# Patient Record
Sex: Female | Born: 1970 | Race: White | Hispanic: No | Marital: Married | Smoking: Never smoker
Health system: Southern US, Community
[De-identification: ages and names within clinical notes are randomized; demographics above are authoritative.]

## PROBLEM LIST (undated history)

## (undated) DIAGNOSIS — K9041 Non-celiac gluten sensitivity: Secondary | ICD-10-CM

## (undated) DIAGNOSIS — D649 Anemia, unspecified: Secondary | ICD-10-CM

## (undated) DIAGNOSIS — E538 Deficiency of other specified B group vitamins: Secondary | ICD-10-CM

## (undated) DIAGNOSIS — K219 Gastro-esophageal reflux disease without esophagitis: Secondary | ICD-10-CM

## (undated) DIAGNOSIS — E041 Nontoxic single thyroid nodule: Secondary | ICD-10-CM

## (undated) HISTORY — DX: Non-celiac gluten sensitivity: K90.41

## (undated) HISTORY — DX: Anemia, unspecified: D64.9

## (undated) HISTORY — PX: TOOTH EXTRACTION: SUR596

---

## 2001-06-12 HISTORY — PX: TUBAL LIGATION: SHX77

## 2008-05-05 ENCOUNTER — Encounter: Payer: Self-pay | Admitting: Family Medicine

## 2008-05-06 LAB — CONVERTED CEMR LAB
HDL: 46 mg/dL
LDL Cholesterol: 70 mg/dL

## 2008-09-06 ENCOUNTER — Encounter: Payer: Self-pay | Admitting: Family Medicine

## 2008-10-13 LAB — CONVERTED CEMR LAB

## 2008-10-31 ENCOUNTER — Encounter: Payer: Self-pay | Admitting: Family Medicine

## 2010-07-27 ENCOUNTER — Ambulatory Visit: Payer: Self-pay | Admitting: Family Medicine

## 2010-07-27 DIAGNOSIS — E0590 Thyrotoxicosis, unspecified without thyrotoxic crisis or storm: Secondary | ICD-10-CM | POA: Insufficient documentation

## 2010-07-27 DIAGNOSIS — D649 Anemia, unspecified: Secondary | ICD-10-CM | POA: Insufficient documentation

## 2010-07-27 DIAGNOSIS — Z862 Personal history of diseases of the blood and blood-forming organs and certain disorders involving the immune mechanism: Secondary | ICD-10-CM

## 2010-07-27 DIAGNOSIS — Z8639 Personal history of other endocrine, nutritional and metabolic disease: Secondary | ICD-10-CM

## 2010-07-27 DIAGNOSIS — E559 Vitamin D deficiency, unspecified: Secondary | ICD-10-CM | POA: Insufficient documentation

## 2010-07-31 LAB — CONVERTED CEMR LAB
Basophils Absolute: 0 10*3/uL (ref 0.0–0.1)
Basophils Relative: 0.7 % (ref 0.0–3.0)
Eosinophils Absolute: 0.1 10*3/uL (ref 0.0–0.7)
Eosinophils Relative: 3.3 % (ref 0.0–5.0)
Free T4: 0.88 ng/dL (ref 0.60–1.60)
HCT: 35.9 % — ABNORMAL LOW (ref 36.0–46.0)
Hemoglobin: 12.4 g/dL (ref 12.0–15.0)
Lymphocytes Relative: 32.9 % (ref 12.0–46.0)
Lymphs Abs: 1.4 10*3/uL (ref 0.7–4.0)
MCHC: 34.7 g/dL (ref 30.0–36.0)
MCV: 92.1 fL (ref 78.0–100.0)
Monocytes Absolute: 0.3 10*3/uL (ref 0.1–1.0)
Monocytes Relative: 8.3 % (ref 3.0–12.0)
Neutro Abs: 2.3 10*3/uL (ref 1.4–7.7)
Neutrophils Relative %: 54.8 % (ref 43.0–77.0)
Platelets: 163 10*3/uL (ref 150.0–400.0)
RBC: 3.9 M/uL (ref 3.87–5.11)
RDW: 13.5 % (ref 11.5–14.6)
T3, Free: 2.9 pg/mL (ref 2.3–4.2)
TSH: 0.78 microintl units/mL (ref 0.35–5.50)
Vit D, 25-Hydroxy: 26 ng/mL — ABNORMAL LOW (ref 30–89)
WBC: 4.2 10*3/uL — ABNORMAL LOW (ref 4.5–10.5)

## 2011-01-01 NOTE — Assessment & Plan Note (Signed)
Summary: NEW PT TO ESTABH/DLO   Vital Signs:  Patient profile:   40 year old female Height:      66 inches Weight:      123.25 pounds BMI:     19.96 Temp:     98.3 degrees F oral Pulse rate:   68 / minute Pulse rhythm:   regular BP sitting:   110 / 62  (right arm) Cuff size:   regular  Vitals Entered By: Linde Gillis CMA Duncan Dull) (July 27, 2010 10:46 AM) CC: new patient, establish care   History of Present Illness: 40 yo here to establish care with no complaints.  Bring her old records with her from [facility].  h/o anemia- most recently checked in 09/2008.  Normal- hbg 12.6, MCV 89.6. Denies any symptoms of SOB or DOE.  Is often fatigued, but she believes that is from her busy lifestyle. Periods are relatively normal, sometimes on the heavy side.  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  When she eats gluten, defintely has abdominal cramping, diarrhea.  Eating a gluten free diet, feels much better.  Unsure if her anemia was related to her gluten insensitivity.  Vitamin D deficiency- Vit D was 26 in 6./2009.  Took a Vitamin D supplement at that time, not taking anything currently.  Is slightly more fatigued than usual.  h/o hyperthyroidism- labs normal in 2009.  Denies any symptoms of hypo or hyperthyroidism.  Well woman- G2P2.  Due for labs.  She refuses all immunizations for religious reasons.  Prefers GYN for Pap smears.  Last pap in 2009, no h/o abnormal pap smears.  s/p BTL in 2002.  Preventive Screening-Counseling & Management  Alcohol-Tobacco     Smoking Status: never  Caffeine-Diet-Exercise     Does Patient Exercise: yes      Drug Use:  no.    Current Medications (verified): 1)  None  Allergies (verified): 1)  ! Ocuflox (Ofloxacin)  Past History:  Family History: Last updated: 07/27/2010 Mom- h/o thyroid cancer.  Social History: Last updated: 07/27/2010 Married 2 children Never Smoked Religion affecting care-  no immunizations Alcohol use-yes Drug use-no Regular exercise-yes Chiropractor  Risk Factors: Exercise: yes (07/27/2010)  Risk Factors: Smoking Status: never (07/27/2010)  Past Medical History: Anemia-NOS Vitamin D deficiency Gluten intolerance  Past Surgical History: Tubal ligation 06/12/2001  Family History: Mom- h/o thyroid cancer.  Social History: Married 2 children Never Smoked Religion affecting care- no immunizations Alcohol use-yes Drug use-no Regular exercise-yes Chiropractor Smoking Status:  never Drug Use:  no Does Patient Exercise:  yes  Review of Systems      See HPI General:  Complains of fatigue; denies weakness and weight loss. Eyes:  Denies blurring. ENT:  Denies difficulty swallowing. CV:  Denies chest pain or discomfort and difficulty breathing at night. Resp:  Denies shortness of breath. GI:  Denies abdominal pain, bloody stools, change in bowel habits, and constipation. GU:  Denies abnormal vaginal bleeding. MS:  Denies joint pain, joint redness, and joint swelling. Derm:  Denies rash. Neuro:  Denies headaches. Psych:  Denies anxiety and depression. Endo:  Denies cold intolerance, excessive thirst, heat intolerance, and polyuria. Heme:  Denies abnormal bruising, bleeding, fevers, pallor, and skin discoloration.  Physical Exam  General:  alert, well-developed, and well-nourished.   Head:  normocephalic.   Eyes:  vision grossly intact and pupils equal.   Ears:  R ear normal and L ear normal.   Nose:  no external deformity.  Mouth:  good dentition.   Neck:  No deformities, masses, or tenderness noted. Lungs:  Normal respiratory effort, chest expands symmetrically. Lungs are clear to auscultation, no crackles or wheezes. Heart:  Normal rate and regular rhythm. S1 and S2 normal without gallop, murmur, click, rub or other extra sounds. Abdomen:  Bowel sounds positive,abdomen soft and non-tender without masses, organomegaly or hernias  noted. Msk:  No deformity or scoliosis noted of thoracic or lumbar spine.   Extremities:  no edema Neurologic:  alert & oriented X3, strength normal in all extremities, and gait normal.   Skin:  Intact without suspicious lesions or rashes Psych:  Cognition and judgment appear intact. Alert and cooperative with normal attention span and concentration. No apparent delusions, illusions, hallucinations   Impression & Recommendations:  Problem # 1:  UNSPECIFIED VITAMIN D DEFICIENCY (ICD-268.9) Assessment Unchanged Recheck Vit D today. Orders: Venipuncture (54098) T-Vitamin D (25-Hydroxy) (11914-78295) Specimen Handling (62130)  Problem # 2:  ANEMIA-NOS (ICD-285.9) Assessment: Unchanged h/o anemia, ? if related to celiac. Recheck CBC today. Orders: Venipuncture (86578) TLB-CBC Platelet - w/Differential (85025-CBCD)  Problem # 3:  HYPERTHYROIDISM, HX OF (ICD-V12.2) Assessment: Unchanged Recheck Thyroid panel. Orders: Venipuncture (46962) TLB-TSH (Thyroid Stimulating Hormone) (84443-TSH) TLB-T4 (Thyrox), Free [phone]) TLB-T3, Free (Triiodothyronine) (84481-T3FREE)  Problem # 4:  Preventive Health Care (ICD-V70.0) Reviewed preventive care protocols, scheduled due services, and updated immunizations Discussed nutrition, exercise, diet, and healthy lifestyle.  Refer to GYN for pap. Refuses all immunizations. FLP today.  Other Orders: Gynecologic Referral (Gyn)  Patient Instructions: 1)  Please stop by to see Shirlee Limerick on your way out. 2)  Have a great weekend.  Prior Medications (reviewed today): None Current Allergies (reviewed today): ! OCUFLOX (OFLOXACIN)  Flu Vaccine Next Due:  Refused TD Next Due:  Refused HDL Result Date:  05/06/2008 HDL Result:  46 LDL Result Date:  05/06/2008 LDL Result:  70 PAP Result Date:  10/13/2008 PAP Result:  historical PAP Next Due:  2 yr

## 2011-05-10 ENCOUNTER — Encounter: Payer: Self-pay | Admitting: Family Medicine

## 2011-05-10 LAB — HM PAP SMEAR

## 2011-05-14 ENCOUNTER — Encounter: Payer: Self-pay | Admitting: Family Medicine

## 2011-05-14 ENCOUNTER — Ambulatory Visit (INDEPENDENT_AMBULATORY_CARE_PROVIDER_SITE_OTHER): Payer: 59 | Admitting: Family Medicine

## 2011-05-14 VITALS — BP 110/70 | HR 85 | Temp 97.9°F | Ht 65.5 in | Wt 129.8 lb

## 2011-05-14 DIAGNOSIS — Z0000 Encounter for general adult medical examination without abnormal findings: Secondary | ICD-10-CM

## 2011-05-14 DIAGNOSIS — Z111 Encounter for screening for respiratory tuberculosis: Secondary | ICD-10-CM

## 2011-05-14 DIAGNOSIS — Z20820 Contact with and (suspected) exposure to varicella: Secondary | ICD-10-CM

## 2011-05-14 DIAGNOSIS — Z23 Encounter for immunization: Secondary | ICD-10-CM

## 2011-05-14 LAB — POCT URINALYSIS DIPSTICK
Bilirubin, UA: NEGATIVE
Blood, UA: NEGATIVE
Glucose, UA: NEGATIVE
Ketones, UA: NEGATIVE
Leukocytes, UA: NEGATIVE
Nitrite, UA: NEGATIVE
Spec Grav, UA: 1.03
Urobilinogen, UA: NEGATIVE
pH, UA: 5

## 2011-05-14 NOTE — Progress Notes (Signed)
History of Present Illness:  40 yo here for physical exam/forms from work.     She is going back to [facility] for radiography, currently a practicing chiropractor. Has no complaints. Typically does get immunizations but she is ok with getting required immunizations today, including Td, ppd and varicella titers.    The PMH, PSH, Social History, Family History, Medications, and allergies have been reviewed in [facility], and have been updated if relevant.   Review of Systems  See HPI  Patient reports no  vision/ hearing changes,anorexia, weight change, fever ,adenopathy, persistant / recurrent hoarseness, swallowing issues, chest pain, edema,persistant / recurrent cough, hemoptysis, dyspnea(rest, exertional, paroxysmal nocturnal), gastrointestinal  bleeding (melena, rectal bleeding), abdominal pain, excessive heart burn, GU symptoms(dysuria, hematuria, pyuria, voiding/incontinence  Issues) syncope, focal weakness, severe memory loss, concerning skin lesions, depression, anxiety, abnormal bruising/bleeding, major joint swelling, breast masses or abnormal vaginal bleeding.     Physical Exam  BP 110/70  Pulse 85  Temp(Src) 97.9 F (36.6 C) (Oral)  Ht 5' 5.5" (1.664 m)  Wt 129 lb 12 oz (58.854 kg)  BMI 21.26 kg/m2  General: alert, well-developed, and well-nourished.  Head: normocephalic.  Eyes: vision grossly intact and pupils equal.  Ears: R ear normal and L ear normal.  Nose: no external deformity.  Mouth: good dentition.  Neck: No deformities, masses, or tenderness noted.  Lungs: Normal respiratory effort, chest expands symmetrically. Lungs are clear to auscultation, no crackles or wheezes.  Heart: Normal rate and regular rhythm. S1 and S2 normal without gallop, murmur, click, rub or other extra sounds.  Abdomen: Bowel sounds positive,abdomen soft and non-tender without masses, organomegaly or hernias noted.  Msk: No deformity or scoliosis noted of thoracic or lumbar spine.  Extremities: no edema   Neurologic: alert & oriented X3, strength normal in all extremities, and gait normal.  Skin: Intact without suspicious lesions or rashes  Psych: Cognition and judgment appear intact. Alert and cooperative with normal attention span and concentration. No apparent delusions, illusions, hallucinations   1. Routine general medical examination at a health care facility     Form filled out and returned to pt. She will return to office to have PPD read. Varicella titers drawn today.

## 2011-05-14 NOTE — Progress Notes (Signed)
Addended by: Gilmer Mor on: 05/14/2011 09:47 AM   Modules accepted: Orders

## 2011-05-15 LAB — VARICELLA ZOSTER ANTIBODY, IGG: Varicella IgG: 3.39 {ISR} — ABNORMAL HIGH

## 2011-05-16 ENCOUNTER — Ambulatory Visit (INDEPENDENT_AMBULATORY_CARE_PROVIDER_SITE_OTHER): Payer: 59 | Admitting: Family Medicine

## 2011-05-16 DIAGNOSIS — IMO0001 Reserved for inherently not codable concepts without codable children: Secondary | ICD-10-CM

## 2011-05-16 DIAGNOSIS — Z111 Encounter for screening for respiratory tuberculosis: Secondary | ICD-10-CM

## 2011-05-16 LAB — READ PPD: TB Skin Test: NEGATIVE mm

## 2011-05-17 NOTE — Progress Notes (Signed)
  Subjective:    Patient ID: Shari Vasquez, female    DOB: [birth year], 40 y.o.   MRN: [id number]  HPI TB test read, neg   Review of Systems     Objective:   Physical Exam        Assessment & Plan:

## 2011-05-18 LAB — VARICELLA ZOSTER ANTIBODY, IGM: Varicella Zoster Ab IgM: 0 (ref <0.91)

## 2011-06-25 ENCOUNTER — Ambulatory Visit (INDEPENDENT_AMBULATORY_CARE_PROVIDER_SITE_OTHER): Payer: 59 | Admitting: Family Medicine

## 2011-06-25 DIAGNOSIS — Z111 Encounter for screening for respiratory tuberculosis: Secondary | ICD-10-CM

## 2011-06-25 NOTE — Progress Notes (Signed)
Second PPD skin test placed today during nurse visit.  Patient needs this for her job.  She will return on Thursday to have it read.

## 2011-06-27 ENCOUNTER — Ambulatory Visit (INDEPENDENT_AMBULATORY_CARE_PROVIDER_SITE_OTHER): Payer: 59 | Admitting: Family Medicine

## 2011-06-27 DIAGNOSIS — IMO0001 Reserved for inherently not codable concepts without codable children: Secondary | ICD-10-CM

## 2011-06-27 DIAGNOSIS — Z111 Encounter for screening for respiratory tuberculosis: Secondary | ICD-10-CM

## 2011-06-27 LAB — TB SKIN TEST
Induration: 0
TB Skin Test: NEGATIVE mm

## 2011-06-27 NOTE — Progress Notes (Signed)
PPD read today during nurse visit.  Negative.

## 2011-11-12 ENCOUNTER — Ambulatory Visit (INDEPENDENT_AMBULATORY_CARE_PROVIDER_SITE_OTHER): Payer: 59 | Admitting: Family Medicine

## 2011-11-12 ENCOUNTER — Encounter: Payer: Self-pay | Admitting: Family Medicine

## 2011-11-12 VITALS — BP 110/78 | HR 72 | Temp 98.1°F | Ht 65.5 in | Wt 127.5 lb

## 2011-11-12 DIAGNOSIS — R300 Dysuria: Secondary | ICD-10-CM

## 2011-11-12 LAB — POCT URINALYSIS DIPSTICK
Bilirubin, UA: NEGATIVE
Blood, UA: NEGATIVE
Glucose, UA: NEGATIVE
Ketones, UA: NEGATIVE
Leukocytes, UA: NEGATIVE
Nitrite, UA: NEGATIVE
Spec Grav, UA: 1.01
Urobilinogen, UA: 2
pH, UA: 8

## 2011-11-12 MED ORDER — CEPHALEXIN 500 MG PO CAPS: 500.0000 mg | ORAL_CAPSULE | Freq: Two times a day (BID) | ORAL | Status: AC

## 2011-11-12 NOTE — Patient Instructions (Signed)
Continue drinking fluids. We will send your urine for culture. Go ahead and start taking Keflex, we will call you in 48 hours with your cx results.

## 2011-11-12 NOTE — Progress Notes (Signed)
SUBJECTIVE: Shari Vasquez is a 40 y.o. female who complains of urinary frequency, urgency and dysuria x 5 days, without flank pain, fever, chills, or abnormal vaginal discharge or bleeding.   Cipro allergy.  Patient Active Problem List  Diagnoses  . UNSPECIFIED VITAMIN D DEFICIENCY  . ANEMIA-NOS  . HYPERTHYROIDISM, HX OF  . Routine general medical examination at a health care facility   Past Medical History  Diagnosis Date  . Anemia   . Vitamin D deficiency   . Gluten intolerance    Past Surgical History  Procedure Date  . Tubal ligation 06/12/2001   History  Substance Use Topics  . Smoking status: Never Smoker   . Smokeless tobacco: Not on file  . Alcohol Use: Yes   Family History  Problem Relation Age of Onset  . Cancer Mother     thyroid   Allergies not on file No current outpatient prescriptions on file prior to visit.   The PMH, PSH, Social History, Family History, Medications, and allergies have been reviewed in [facility], and have been updated if relevant.  OBJECTIVE: Appears well, in no apparent distress.  Vital signs are normal. The abdomen is soft without tenderness, guarding, mass, rebound or organomegaly. No CVA tenderness or inguinal adenopathy noted. Urine dipstick shows negative for all components.  Micro exam: not done.   ASSESSMENT/Plan: UA neg but classic UTI symptoms.  Will treat for UTI uncomplicated without evidence of pyelonephritis and send urine for cx.  Keflex 500 mg twice daily x7 days, also push fluids, may use Pyridium OTC prn. Call or return to clinic prn if these symptoms worsen or fail to improve as anticipated.

## 2011-11-14 LAB — URINE CULTURE
Colony Count: NO GROWTH
Organism ID, Bacteria: NO GROWTH

## 2012-06-16 ENCOUNTER — Ambulatory Visit (INDEPENDENT_AMBULATORY_CARE_PROVIDER_SITE_OTHER): Payer: 59

## 2012-06-16 DIAGNOSIS — Z111 Encounter for screening for respiratory tuberculosis: Secondary | ICD-10-CM

## 2012-06-16 NOTE — Progress Notes (Signed)
Jacki Cones said had spoken with Dr Dayton Martes to order the PPD to be done.

## 2012-06-17 ENCOUNTER — Ambulatory Visit: Payer: 59

## 2012-06-18 LAB — TB SKIN TEST
Induration: 0 mm
TB Skin Test: NEGATIVE

## 2012-07-07 ENCOUNTER — Ambulatory Visit: Payer: 59

## 2012-07-30 ENCOUNTER — Other Ambulatory Visit: Payer: Self-pay | Admitting: Family Medicine

## 2012-07-30 ENCOUNTER — Encounter: Payer: Self-pay | Admitting: Family Medicine

## 2012-07-30 ENCOUNTER — Ambulatory Visit (INDEPENDENT_AMBULATORY_CARE_PROVIDER_SITE_OTHER): Payer: 59 | Admitting: Family Medicine

## 2012-07-30 VITALS — BP 110/72 | HR 60 | Temp 97.9°F | Ht 65.0 in | Wt 129.0 lb

## 2012-07-30 DIAGNOSIS — Z862 Personal history of diseases of the blood and blood-forming organs and certain disorders involving the immune mechanism: Secondary | ICD-10-CM

## 2012-07-30 DIAGNOSIS — R079 Chest pain, unspecified: Secondary | ICD-10-CM

## 2012-07-30 DIAGNOSIS — E559 Vitamin D deficiency, unspecified: Secondary | ICD-10-CM

## 2012-07-30 DIAGNOSIS — D649 Anemia, unspecified: Secondary | ICD-10-CM

## 2012-07-30 DIAGNOSIS — Z0000 Encounter for general adult medical examination without abnormal findings: Secondary | ICD-10-CM

## 2012-07-30 DIAGNOSIS — D72819 Decreased white blood cell count, unspecified: Secondary | ICD-10-CM

## 2012-07-30 DIAGNOSIS — Z136 Encounter for screening for cardiovascular disorders: Secondary | ICD-10-CM

## 2012-07-30 DIAGNOSIS — Z8639 Personal history of other endocrine, nutritional and metabolic disease: Secondary | ICD-10-CM

## 2012-07-30 LAB — COMPREHENSIVE METABOLIC PANEL
ALT: 11 U/L (ref 0–35)
AST: 18 U/L (ref 0–37)
Albumin: 4.2 g/dL (ref 3.5–5.2)
Alkaline Phosphatase: 40 U/L (ref 39–117)
BUN: 10 mg/dL (ref 6–23)
CO2: 25 mEq/L (ref 19–32)
Calcium: 9.2 mg/dL (ref 8.4–10.5)
Chloride: 104 mEq/L (ref 96–112)
Creatinine, Ser: 0.9 mg/dL (ref 0.4–1.2)
GFR: 72.57 mL/min (ref >60.00)
Glucose, Bld: 85 mg/dL (ref 70–99)
Potassium: 4.6 mEq/L (ref 3.5–5.1)
Sodium: 136 mEq/L (ref 135–145)
Total Bilirubin: 0.9 mg/dL (ref 0.3–1.2)
Total Protein: 7.5 g/dL (ref 6.0–8.3)

## 2012-07-30 LAB — CBC WITH DIFFERENTIAL/PLATELET
Basophils Absolute: 0 10*3/uL (ref 0.0–0.1)
Basophils Relative: 0.5 % (ref 0.0–3.0)
Eosinophils Absolute: 0.1 10*3/uL (ref 0.0–0.7)
Eosinophils Relative: 2.6 % (ref 0.0–5.0)
HCT: 37.5 % (ref 36.0–46.0)
Hemoglobin: 12.6 g/dL (ref 12.0–15.0)
Lymphocytes Relative: 40.3 % (ref 12.0–46.0)
Lymphs Abs: 1.2 10*3/uL (ref 0.7–4.0)
MCHC: 33.6 g/dL (ref 30.0–36.0)
MCV: 90.4 fl (ref 78.0–100.0)
Monocytes Absolute: 0.3 10*3/uL (ref 0.1–1.0)
Monocytes Relative: 9 % (ref 3.0–12.0)
Neutro Abs: 1.4 10*3/uL (ref 1.4–7.7)
Neutrophils Relative %: 47.6 % (ref 43.0–77.0)
Platelets: 155 10*3/uL (ref 150.0–400.0)
RBC: 4.15 Mil/uL (ref 3.87–5.11)
RDW: 13 % (ref 11.5–14.6)
WBC: 3 10*3/uL — ABNORMAL LOW (ref 4.5–10.5)

## 2012-07-30 LAB — LIPID PANEL
Cholesterol: 121 mg/dL (ref 0–200)
HDL: 46.7 mg/dL (ref >39.00)
LDL Cholesterol: 67 mg/dL (ref 0–99)
Total CHOL/HDL Ratio: 3
Triglycerides: 38 mg/dL (ref 0.0–149.0)
VLDL: 7.6 mg/dL (ref 0.0–40.0)

## 2012-07-30 NOTE — Progress Notes (Signed)
History of Present Illness:  41 yo here for CPX.  Had pap smear with GYN yesterday. Mammogram scheduled.  Overall, doing well.  She has had intermittent episodes of feeling "bubbles in her epigastric area/chest pain." Never exertional.  Not associated with certain foods. Exercises regularly and has not had any CP or DOE. She does drink 5 cans of soda per day but she does not feel that she drinks more than that when she has these episodes. They are fleeting, tend to occur in multiple episodes.  Has not had an episode since June.    Patient Active Problem List  Diagnosis  . UNSPECIFIED VITAMIN D DEFICIENCY  . ANEMIA-NOS  . HYPERTHYROIDISM, HX OF  . Routine general medical examination at a health care facility   Past Medical History  Diagnosis Date  . Anemia   . Vitamin d deficiency   . Gluten intolerance    Past Surgical History  Procedure Date  . Tubal ligation 06/12/2001   History  Substance Use Topics  . Smoking status: Never Smoker   . Smokeless tobacco: Not on file  . Alcohol Use: Yes   Family History  Problem Relation Age of Onset  . Cancer Mother     thyroid   Allergies  Allergen Reactions  . Ciprofloxacin    No current outpatient prescriptions on file prior to visit.     The PMH, PSH, Social History, Family History, Medications, and allergies have been reviewed in [facility], and have been updated if relevant.   Review of Systems  See HPI  Patient reports no  vision/ hearing changes,anorexia, weight change, fever ,adenopathy, persistant / recurrent hoarseness, swallowing issues, chest pain, edema,persistant / recurrent cough, hemoptysis, dyspnea(rest, exertional, paroxysmal nocturnal), gastrointestinal  bleeding (melena, rectal bleeding), abdominal pain, excessive heart burn, GU symptoms(dysuria, hematuria, pyuria, voiding/incontinence  Issues) syncope, focal weakness, severe memory loss, concerning skin lesions, depression, anxiety, abnormal bruising/bleeding,  major joint swelling, breast masses or abnormal vaginal bleeding.     Physical Exam  BP 110/72  Pulse 60  Temp 97.9 F (36.6 C)  Ht 5\' 5"  (1.651 m)  Wt 129 lb (58.514 kg)  BMI 21.47 kg/m2  General: alert, well-developed, and well-nourished.  Head: normocephalic.  Eyes: vision grossly intact and pupils equal.  Ears: R ear normal and L ear normal.  Nose: no external deformity.  Mouth: good dentition.  Neck: No deformities, masses, or tenderness noted.  Lungs: Normal respiratory effort, chest expands symmetrically. Lungs are clear to auscultation, no crackles or wheezes.  Heart: Normal rate and regular rhythm. S1 and S2 normal without gallop, murmur, click, rub or other extra sounds.  Abdomen: Bowel sounds positive,abdomen soft and non-tender without masses, organomegaly or hernias noted.  Msk: No deformity or scoliosis noted of thoracic or lumbar spine.  Extremities: no edema  Neurologic: alert & oriented X3, strength normal in all extremities, and gait normal.  Skin: Intact without suspicious lesions or rashes  Psych: Cognition and judgment appear intact. Alert and cooperative with normal attention span and concentration. No apparent delusions, illusions, hallucinations    Assessment and Plan: 1. Routine general medical examination at a health care facility  Reviewed preventive care protocols, scheduled due services, and updated immunizations Discussed nutrition, exercise, diet, and healthy lifestyle.  Comprehensive metabolic panel  2. HYPERTHYROIDISM, HX OF    3. Unspecified vitamin D deficiency  Vitamin D, 25-hydroxy  4. ANEMIA-NOS  CBC with Differential  5. Screening for ischemic heart disease  Lipid Panel  6. Chest pain  Atypical- EKG reassuring- NSR. ?palpitations/PVCs vs gerd or esophageal spasm. Has not had episode since June. If symptoms return, she will keep symptom journal call me immediately. Will proceed with EGD and/or Holter monitor. EKG 12-Lead

## 2012-07-30 NOTE — Patient Instructions (Addendum)
Great to see you. We will call you with your lab results.  Try to keep a symptoms journal if your symptoms return- did you drink caffeine that day, is it difficult to swallow, etc. Call me if your symptoms do return- we can consider a Holter Monitor (monitor for irregular heart rhythms).

## 2012-07-31 ENCOUNTER — Telehealth: Payer: Self-pay | Admitting: Family Medicine

## 2012-07-31 LAB — VITAMIN D 25 HYDROXY (VIT D DEFICIENCY, FRACTURES): Vit D, 25-Hydroxy: 28 ng/mL — ABNORMAL LOW (ref 30–89)

## 2012-07-31 NOTE — Telephone Encounter (Signed)
Pt called informed her of her lab results, would like them faxed if normal [phone]

## 2012-07-31 NOTE — Telephone Encounter (Signed)
Left v/m for pt to call back on result note.

## 2012-07-31 NOTE — Telephone Encounter (Signed)
Pt was returning call about lab results. If results are normal she would like them faxed to [phone]. If not please call her back.

## 2012-09-14 ENCOUNTER — Other Ambulatory Visit: Payer: Self-pay | Admitting: Obstetrics

## 2012-09-14 DIAGNOSIS — Z1231 Encounter for screening mammogram for malignant neoplasm of breast: Secondary | ICD-10-CM

## 2012-09-15 ENCOUNTER — Other Ambulatory Visit: Payer: Self-pay | Admitting: Family Medicine

## 2012-09-15 ENCOUNTER — Ambulatory Visit: Payer: Self-pay | Admitting: Hematology and Oncology

## 2012-09-15 ENCOUNTER — Other Ambulatory Visit (INDEPENDENT_AMBULATORY_CARE_PROVIDER_SITE_OTHER): Payer: 59

## 2012-09-15 DIAGNOSIS — E559 Vitamin D deficiency, unspecified: Secondary | ICD-10-CM

## 2012-09-15 DIAGNOSIS — D72819 Decreased white blood cell count, unspecified: Secondary | ICD-10-CM

## 2012-09-15 DIAGNOSIS — D729 Disorder of white blood cells, unspecified: Secondary | ICD-10-CM

## 2012-09-15 LAB — CBC WITH DIFFERENTIAL/PLATELET
Basophils Absolute: 0 10*3/uL (ref 0.0–0.1)
Basophils Relative: 0.8 % (ref 0.0–3.0)
Eosinophils Absolute: 0.1 10*3/uL (ref 0.0–0.7)
Eosinophils Relative: 2.8 % (ref 0.0–5.0)
HCT: 37.9 % (ref 36.0–46.0)
Hemoglobin: 12.6 g/dL (ref 12.0–15.0)
Lymphocytes Relative: 39.1 % (ref 12.0–46.0)
Lymphs Abs: 1.1 10*3/uL (ref 0.7–4.0)
MCHC: 33.1 g/dL (ref 30.0–36.0)
MCV: 91.7 fl (ref 78.0–100.0)
Monocytes Absolute: 0.3 10*3/uL (ref 0.1–1.0)
Monocytes Relative: 9.3 % (ref 3.0–12.0)
Neutro Abs: 1.4 10*3/uL (ref 1.4–7.7)
Neutrophils Relative %: 48 % (ref 43.0–77.0)
Platelets: 175 10*3/uL (ref 150.0–400.0)
RBC: 4.14 Mil/uL (ref 3.87–5.11)
RDW: 13.1 % (ref 11.5–14.6)
WBC: 2.9 10*3/uL — ABNORMAL LOW (ref 4.5–10.5)

## 2012-09-16 LAB — VITAMIN D 25 HYDROXY (VIT D DEFICIENCY, FRACTURES): Vit D, 25-Hydroxy: 25 ng/mL — ABNORMAL LOW (ref 30–89)

## 2012-09-17 ENCOUNTER — Telehealth: Payer: Self-pay

## 2012-09-17 MED ORDER — VITAMIN D3 1.25 MG (50000 UT) PO CAPS: 1.0000 | ORAL_CAPSULE | ORAL | Status: DC

## 2012-09-17 NOTE — Telephone Encounter (Signed)
Pt called to get copy of recent labs mailed to her home. Pt notified done while on phone. Also advised pt of result note re: Vit d3.

## 2012-09-25 ENCOUNTER — Ambulatory Visit
Admission: RE | Admit: 2012-09-25 | Discharge: 2012-09-25 | Disposition: A | Discharge status: Home / Self Care | Payer: 59 | Source: Ambulatory Visit | Attending: Obstetrics | Admitting: Obstetrics

## 2012-09-25 DIAGNOSIS — Z1231 Encounter for screening mammogram for malignant neoplasm of breast: Secondary | ICD-10-CM

## 2012-09-30 ENCOUNTER — Ambulatory Visit: Payer: Self-pay | Admitting: Hematology and Oncology

## 2012-09-30 LAB — CBC CANCER CENTER
Basophil #: 0 x10 3/mm (ref 0.0–0.1)
Basophil %: 1 %
Eosinophil #: 0.1 x10 3/mm (ref 0.0–0.7)
Eosinophil %: 2.7 %
HCT: 39.7 % (ref 35.0–47.0)
HGB: 13.2 g/dL (ref 12.0–16.0)
Lymphocyte #: 1 x10 3/mm (ref 1.0–3.6)
Lymphocyte %: 32.7 %
MCH: 30.3 pg (ref 26.0–34.0)
MCHC: 33.2 g/dL (ref 32.0–36.0)
MCV: 91 fL (ref 80–100)
Monocyte #: 0.2 x10 3/mm (ref 0.2–0.9)
Monocyte %: 8.1 %
Neutrophil #: 1.6 x10 3/mm (ref 1.4–6.5)
Neutrophil %: 55.5 %
Platelet: 180 x10 3/mm (ref 150–440)
RBC: 4.36 10*6/uL (ref 3.80–5.20)
RDW: 12.8 % (ref 11.5–14.5)
WBC: 2.9 x10 3/mm — ABNORMAL LOW (ref 3.6–11.0)

## 2012-10-01 ENCOUNTER — Ambulatory Visit (INDEPENDENT_AMBULATORY_CARE_PROVIDER_SITE_OTHER): Payer: 59

## 2012-10-01 DIAGNOSIS — Z23 Encounter for immunization: Secondary | ICD-10-CM

## 2012-10-02 ENCOUNTER — Ambulatory Visit: Payer: Self-pay | Admitting: Hematology and Oncology

## 2012-10-12 ENCOUNTER — Other Ambulatory Visit: Payer: Self-pay | Admitting: Family Medicine

## 2012-10-13 ENCOUNTER — Other Ambulatory Visit: Payer: Self-pay | Admitting: Family Medicine

## 2012-10-13 NOTE — Telephone Encounter (Signed)
Refill request for vitamin d denied.  Pt was to only have this for 6 weeks, still has some left from previous fill on 09/17/12.

## 2012-10-13 NOTE — Telephone Encounter (Signed)
Is the patient to continue this med?

## 2012-11-04 ENCOUNTER — Ambulatory Visit: Payer: Self-pay | Admitting: Hematology and Oncology

## 2012-11-04 LAB — CBC CANCER CENTER
Basophil #: 0 x10 3/mm (ref 0.0–0.1)
Basophil %: 1.2 %
Eosinophil #: 0.1 x10 3/mm (ref 0.0–0.7)
Eosinophil %: 2.8 %
HCT: 37.8 % (ref 35.0–47.0)
HGB: 12.9 g/dL (ref 12.0–16.0)
Lymphocyte #: 1.1 x10 3/mm (ref 1.0–3.6)
Lymphocyte %: 40.7 %
MCH: 31 pg (ref 26.0–34.0)
MCHC: 34.3 g/dL (ref 32.0–36.0)
MCV: 91 fL (ref 80–100)
Monocyte #: 0.2 x10 3/mm (ref 0.2–0.9)
Monocyte %: 9.1 %
Neutrophil #: 1.3 x10 3/mm — ABNORMAL LOW (ref 1.4–6.5)
Neutrophil %: 46.2 %
Platelet: 188 x10 3/mm (ref 150–440)
RBC: 4.17 10*6/uL (ref 3.80–5.20)
RDW: 13 % (ref 11.5–14.5)
WBC: 2.7 x10 3/mm — ABNORMAL LOW (ref 3.6–11.0)

## 2012-11-04 LAB — MONONUCLEOSIS SCREEN: Mono Test: NEGATIVE

## 2012-11-10 ENCOUNTER — Telehealth: Payer: Self-pay

## 2012-11-10 NOTE — Telephone Encounter (Signed)
Pt left v/m; already scheduled for B 12 level on 11/16/12 at 10:45 am; pt request thyroid panel to be done at that time also. Pt said does not need call back; she will find out on Mon if Dr Dayton Martes agreed to add on.

## 2012-11-11 ENCOUNTER — Other Ambulatory Visit: Payer: Self-pay | Admitting: Family Medicine

## 2012-11-11 DIAGNOSIS — R5381 Other malaise: Secondary | ICD-10-CM

## 2012-11-11 NOTE — Telephone Encounter (Signed)
Yes ok to add on

## 2012-11-11 NOTE — Telephone Encounter (Signed)
Dr Dayton Martes, please specify what test you want. Thank You

## 2012-11-11 NOTE — Telephone Encounter (Signed)
TSH (Dx  780.7)

## 2012-11-16 ENCOUNTER — Other Ambulatory Visit (INDEPENDENT_AMBULATORY_CARE_PROVIDER_SITE_OTHER): Payer: 59

## 2012-11-16 DIAGNOSIS — R5381 Other malaise: Secondary | ICD-10-CM

## 2012-11-16 DIAGNOSIS — R5383 Other fatigue: Secondary | ICD-10-CM

## 2012-11-16 DIAGNOSIS — E559 Vitamin D deficiency, unspecified: Secondary | ICD-10-CM

## 2012-11-16 LAB — TSH: TSH: 1.02 u[IU]/mL (ref 0.35–5.50)

## 2012-11-17 LAB — VITAMIN D 25 HYDROXY (VIT D DEFICIENCY, FRACTURES): Vit D, 25-Hydroxy: 26 ng/mL — ABNORMAL LOW (ref 30–89)

## 2012-11-19 ENCOUNTER — Other Ambulatory Visit: Payer: Self-pay | Admitting: *Deleted

## 2012-11-19 MED ORDER — VITAMIN D (ERGOCALCIFEROL) 1.25 MG (50000 UNIT) PO CAPS: ORAL_CAPSULE | ORAL | Status: DC

## 2012-12-02 ENCOUNTER — Ambulatory Visit: Payer: Self-pay | Admitting: Hematology and Oncology

## 2012-12-16 ENCOUNTER — Other Ambulatory Visit: Payer: Self-pay | Admitting: Family Medicine

## 2012-12-16 NOTE — Telephone Encounter (Signed)
Vitamin D prescribed on 12/19.  She has lab appt to recheck level on 3/5.  Ok to refill?

## 2013-01-02 ENCOUNTER — Ambulatory Visit: Payer: Self-pay | Admitting: Hematology and Oncology

## 2013-01-30 ENCOUNTER — Ambulatory Visit: Payer: Self-pay | Admitting: Hematology and Oncology

## 2013-02-03 ENCOUNTER — Other Ambulatory Visit (INDEPENDENT_AMBULATORY_CARE_PROVIDER_SITE_OTHER): Payer: 59

## 2013-02-03 DIAGNOSIS — E559 Vitamin D deficiency, unspecified: Secondary | ICD-10-CM

## 2013-02-03 DIAGNOSIS — R5383 Other fatigue: Secondary | ICD-10-CM

## 2013-02-03 DIAGNOSIS — R5381 Other malaise: Secondary | ICD-10-CM

## 2013-02-03 LAB — VITAMIN B12: Vitamin B-12: 352 pg/mL (ref 211–911)

## 2013-02-04 LAB — VITAMIN D 25 HYDROXY (VIT D DEFICIENCY, FRACTURES): Vit D, 25-Hydroxy: 30 ng/mL (ref 30–89)

## 2013-03-02 ENCOUNTER — Ambulatory Visit: Payer: Self-pay | Admitting: Hematology and Oncology

## 2013-04-01 ENCOUNTER — Ambulatory Visit: Payer: Self-pay | Admitting: Hematology and Oncology

## 2013-05-02 ENCOUNTER — Ambulatory Visit: Payer: Self-pay | Admitting: Hematology and Oncology

## 2013-06-01 ENCOUNTER — Ambulatory Visit: Payer: Self-pay | Admitting: Hematology and Oncology

## 2013-07-02 ENCOUNTER — Ambulatory Visit: Payer: Self-pay | Admitting: Hematology and Oncology

## 2013-07-08 LAB — COMPREHENSIVE METABOLIC PANEL
Albumin: 4 g/dL (ref 3.4–5.0)
Alkaline Phosphatase: 58 U/L (ref 50–136)
Anion Gap: 8 (ref 7–16)
BUN: 11 mg/dL (ref 7–18)
Bilirubin,Total: 0.5 mg/dL (ref 0.2–1.0)
Calcium, Total: 8.8 mg/dL (ref 8.5–10.1)
Chloride: 104 mmol/L (ref 98–107)
Co2: 28 mmol/L (ref 21–32)
Creatinine: 0.85 mg/dL (ref 0.60–1.30)
EGFR (African American): >60
EGFR (Non-African Amer.): >60
Glucose: 90 mg/dL (ref 65–99)
Osmolality: 278 (ref 275–301)
Potassium: 4.3 mmol/L (ref 3.5–5.1)
SGOT(AST): 15 U/L (ref 15–37)
SGPT (ALT): 14 U/L (ref 12–78)
Sodium: 140 mmol/L (ref 136–145)
Total Protein: 7.5 g/dL (ref 6.4–8.2)

## 2013-07-08 LAB — CBC CANCER CENTER
Basophil #: 0 x10 3/mm (ref 0.0–0.1)
Basophil %: 1 %
Eosinophil #: 0.1 x10 3/mm (ref 0.0–0.7)
Eosinophil %: 3.7 %
HCT: 38 % (ref 35.0–47.0)
HGB: 13.4 g/dL (ref 12.0–16.0)
Lymphocyte #: 1.1 x10 3/mm (ref 1.0–3.6)
Lymphocyte %: 35.5 %
MCH: 31.6 pg (ref 26.0–34.0)
MCHC: 35.3 g/dL (ref 32.0–36.0)
MCV: 90 fL (ref 80–100)
Monocyte #: 0.2 x10 3/mm (ref 0.2–0.9)
Monocyte %: 8 %
Neutrophil #: 1.6 x10 3/mm (ref 1.4–6.5)
Neutrophil %: 51.8 %
Platelet: 167 x10 3/mm (ref 150–440)
RBC: 4.25 10*6/uL (ref 3.80–5.20)
RDW: 12.9 % (ref 11.5–14.5)
WBC: 3.1 x10 3/mm — ABNORMAL LOW (ref 3.6–11.0)

## 2013-07-08 LAB — TSH: Thyroid Stimulating Horm: 0.99 u[IU]/mL

## 2013-07-08 LAB — SEDIMENTATION RATE: Erythrocyte Sed Rate: 6 mm/hr (ref 0–20)

## 2013-08-02 ENCOUNTER — Ambulatory Visit: Payer: Self-pay | Admitting: Hematology and Oncology

## 2013-08-14 IMAGING — MG MM SCREEN MAMMOGRAM BILATERAL
5 series · 5 of 5 positions shown · non-contrast
Comparison: Previous exams.

CLINICAL DATA: Screening.

DIGITAL BILATERAL SCREENING MAMMOGRAM WITH CAD

[R CC]
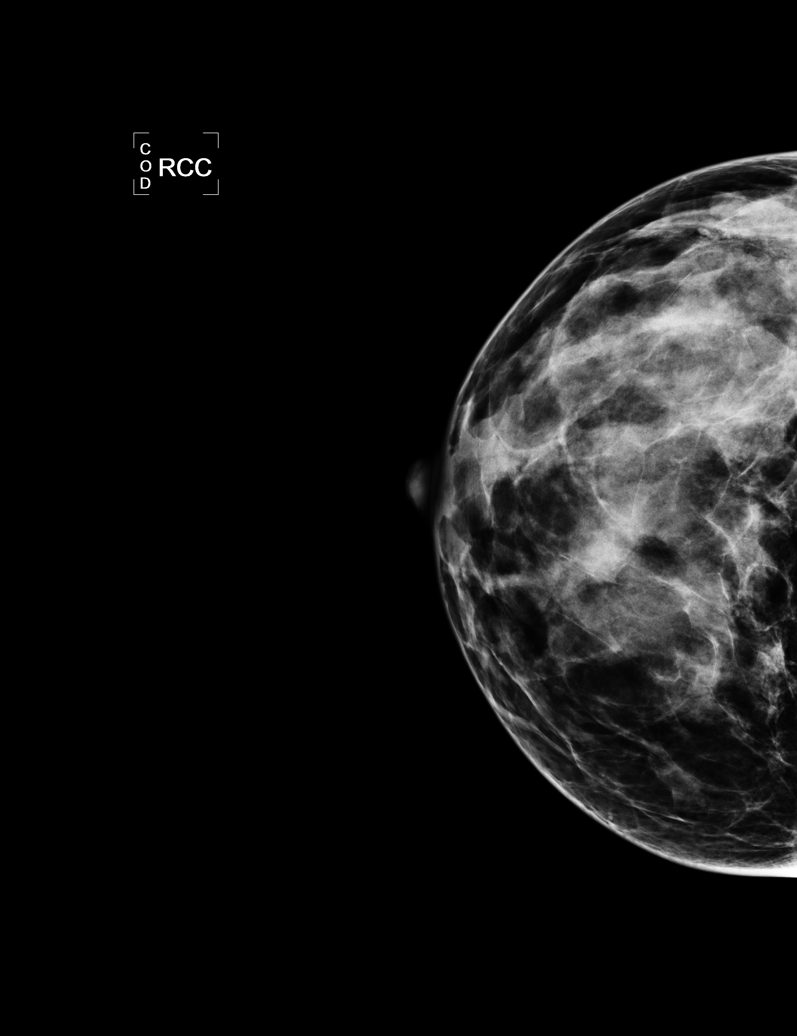

[L CC]
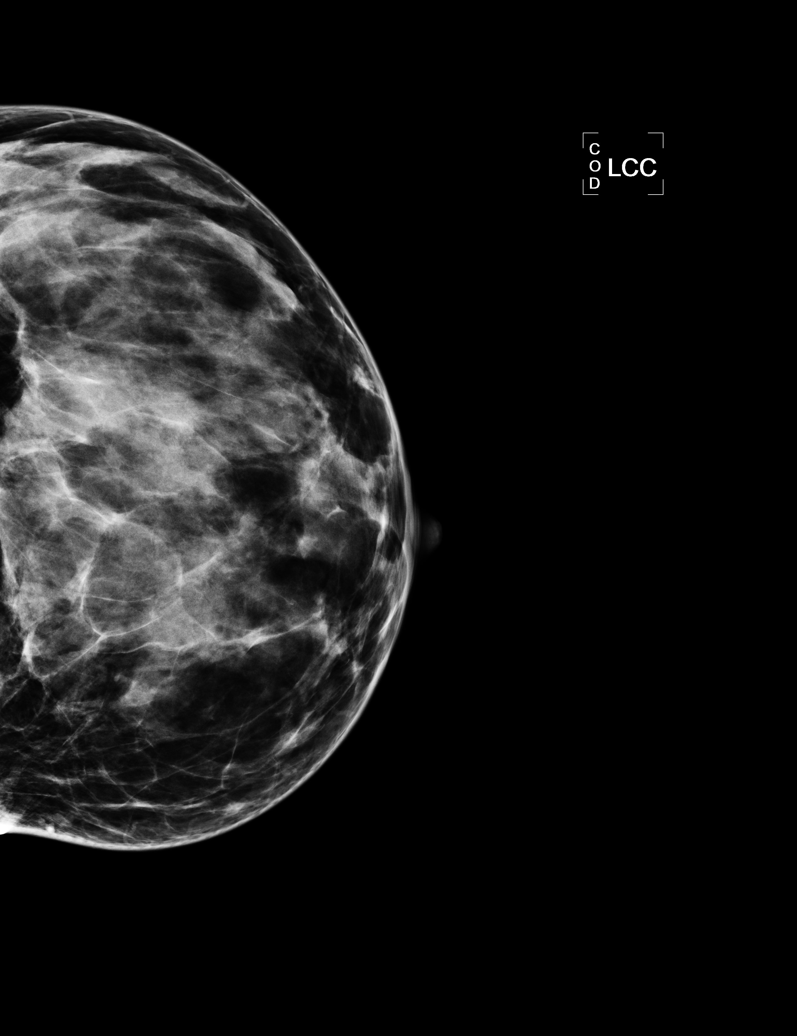

[L MLO]
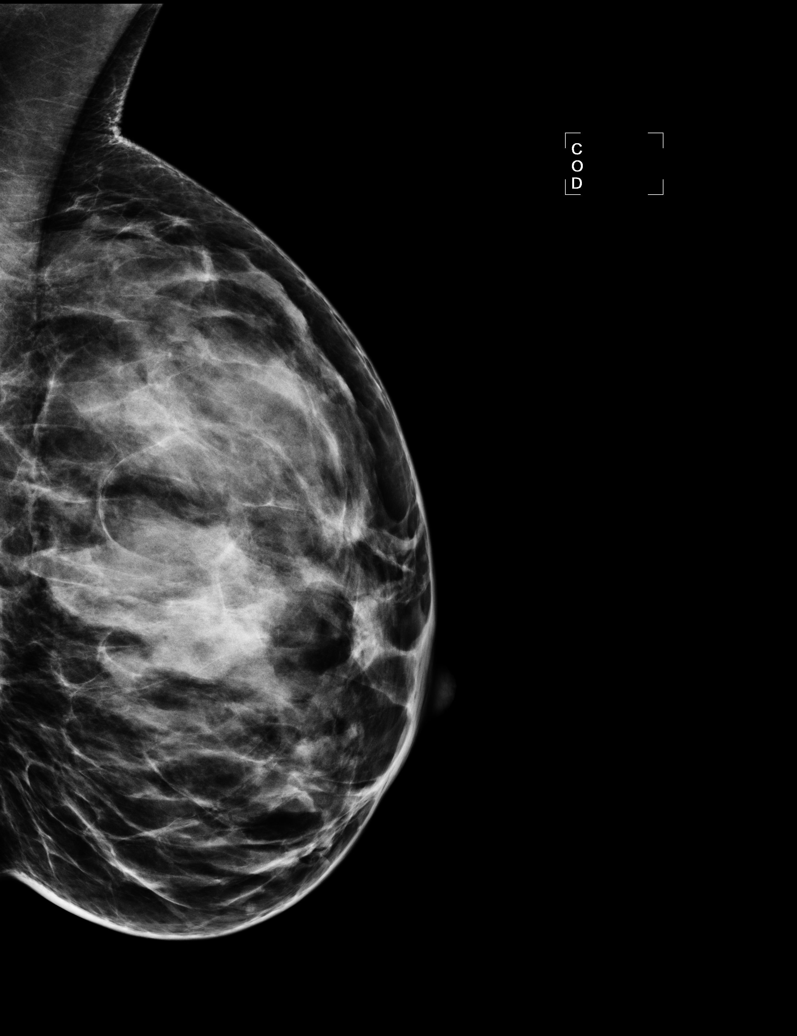

[R MLO (1 of 2)]
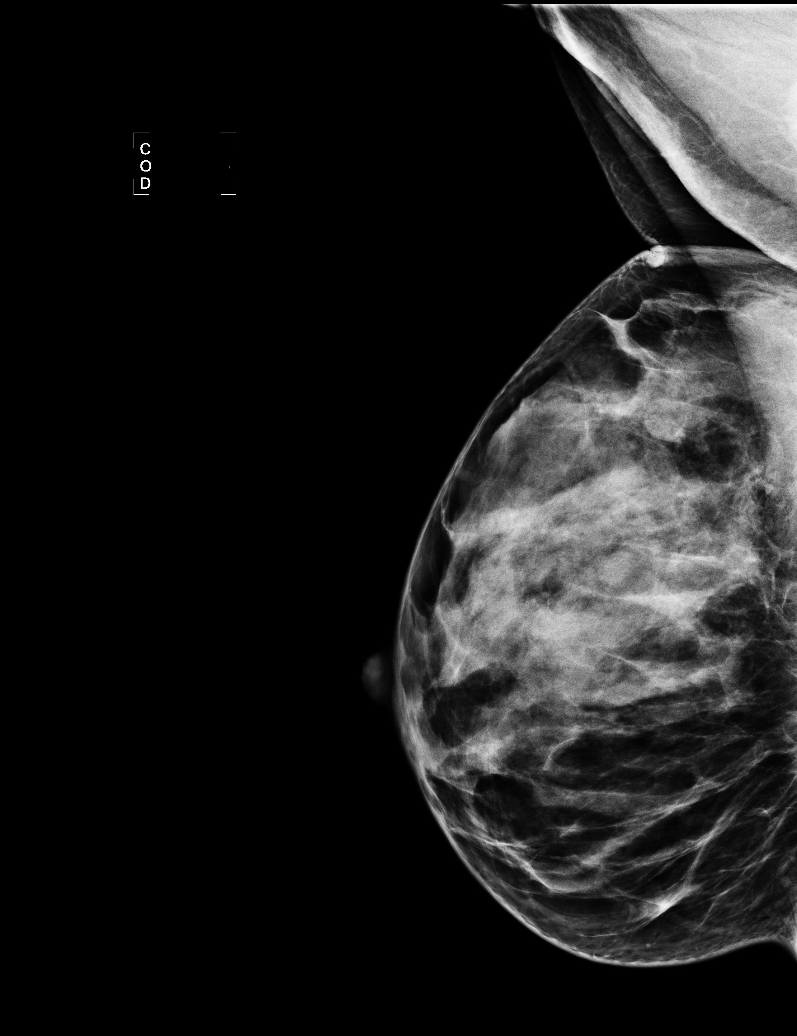

[R MLO (2 of 2)]
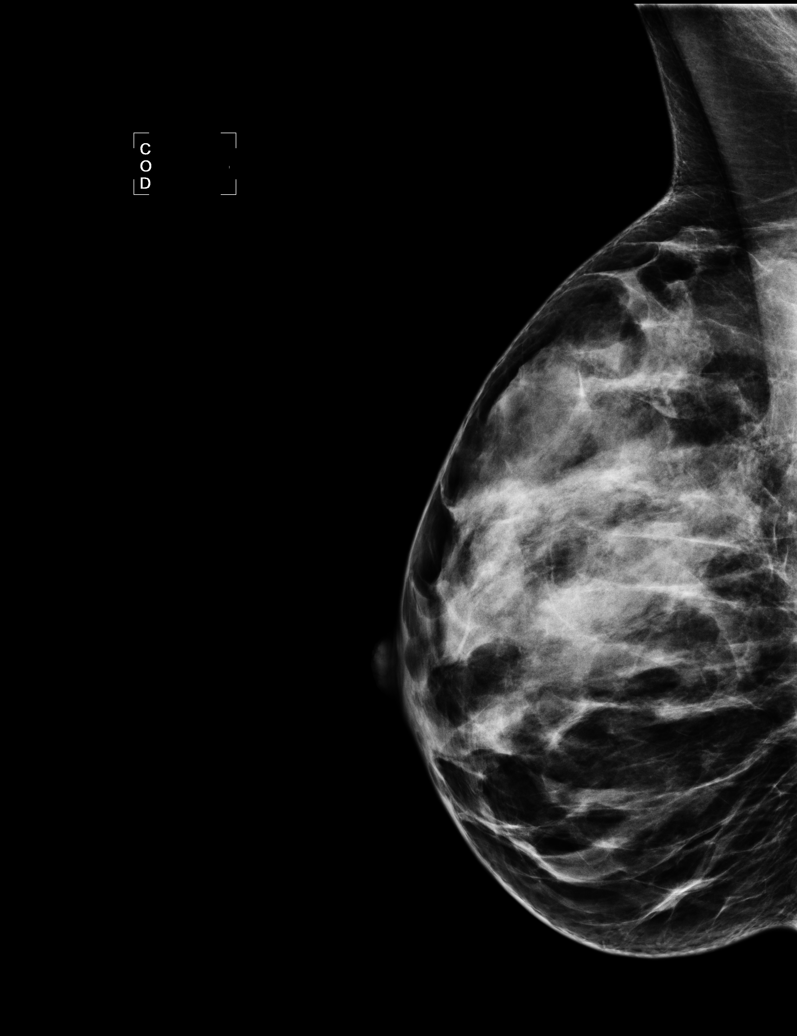

[5 of 5 positions shown; findings below may reference images not displayed]

FINDINGS: The breast tissue is heterogeneously dense. No
suspicious masses, architectural distortion, or calcifications are
present.

Images were processed with CAD.
IMPRESSION: No mammographic evidence of malignancy.

A result letter of this screening mammogram will be mailed directly
to the patient.

RECOMMENDATION:
Screening mammogram in one year. (Code:PX-P-87Q)

BI-RADS CATEGORY 1:  Negative.

## 2013-09-01 ENCOUNTER — Ambulatory Visit: Payer: Self-pay | Admitting: Hematology and Oncology

## 2013-09-14 ENCOUNTER — Other Ambulatory Visit: Payer: Self-pay

## 2013-09-14 DIAGNOSIS — Z1231 Encounter for screening mammogram for malignant neoplasm of breast: Secondary | ICD-10-CM

## 2013-10-02 ENCOUNTER — Ambulatory Visit: Payer: Self-pay | Admitting: Hematology and Oncology

## 2013-10-18 ENCOUNTER — Ambulatory Visit: Payer: 59

## 2013-11-01 ENCOUNTER — Ambulatory Visit: Payer: Self-pay | Admitting: Hematology and Oncology

## 2013-11-11 ENCOUNTER — Encounter: Payer: Self-pay | Admitting: Internal Medicine

## 2013-11-11 ENCOUNTER — Ambulatory Visit (INDEPENDENT_AMBULATORY_CARE_PROVIDER_SITE_OTHER): Payer: 59 | Admitting: Internal Medicine

## 2013-11-11 VITALS — BP 114/72 | HR 69 | Temp 98.0°F | Ht 66.0 in | Wt 129.8 lb

## 2013-11-11 DIAGNOSIS — R0989 Other specified symptoms and signs involving the circulatory and respiratory systems: Secondary | ICD-10-CM

## 2013-11-11 DIAGNOSIS — I493 Ventricular premature depolarization: Secondary | ICD-10-CM

## 2013-11-11 DIAGNOSIS — I4949 Other premature depolarization: Secondary | ICD-10-CM

## 2013-11-11 NOTE — Progress Notes (Signed)
Subjective:    Patient ID: Shari Vasquez, female    DOB: Apr 11, 1971, 42 y.o.   MRN: [id number]  HPI  Pt presents to the clinic today with c/o an irregular heart rhythm. She reports this was found on a wellness visit at work. Someone felt her radial pulse and told her it was irregular and that she needed to have it checked by her PCP. She denies any symptoms, palpitations, chest tightness, fatigue or SOB.  Review of Systems      Past Medical History  Diagnosis Date  . Anemia   . Vitamin D deficiency   . Gluten intolerance     No current outpatient prescriptions on file.   No current facility-administered medications for this visit.    Allergies  Allergen Reactions  . Ciprofloxacin     Family History  Problem Relation Age of Onset  . Cancer Mother     thyroid    History   Social History  . Marital Status: Married    Spouse Name: N/A    Number of Children: 2  . Years of Education: N/A   Occupational History  . Chiropractor    Social History Main Topics  . Smoking status: Never Smoker   . Smokeless tobacco: Not on file  . Alcohol Use: Yes  . Drug Use: No  . Sexual Activity: Not on file   Other Topics Concern  . Not on file   Social History Narrative   Religion affecting care-no immunizations   Regular exercise     Constitutional: Denies fever, malaise, fatigue, headache or abrupt weight changes.  Respiratory: Denies difficulty breathing, shortness of breath, cough or sputum production.   Cardiovascular: Denies chest pain, chest tightness, palpitations or swelling in the hands or feet.  Neurological: Denies dizziness, difficulty with memory, difficulty with speech or problems with balance and coordination.   No other specific complaints in a complete review of systems (except as listed in HPI above).  Objective:   Physical Exam   BP 114/72  Pulse 69  Temp(Src) 98 F (36.7 C) (Oral)  Ht 5\' 6"  (1.676 m)  Wt 129 lb 12 oz (58.854 kg)  BMI 20.95  kg/m2  SpO2 98%  LMP 11/03/2013 Wt Readings from Last 3 Encounters:  11/11/13 129 lb 12 oz (58.854 kg)  07/30/12 129 lb (58.514 kg)  11/12/11 127 lb 8 oz (57.834 kg)    General: Appears her stated age, well developed, well nourished in NAD. Cardiovascular: Normal rate and rhythm. S1,S2 noted.  No murmur, rubs or gallops noted. No JVD or BLE edema. No carotid bruits noted. Pulmonary/Chest: Normal effort and positive vesicular breath sounds. No respiratory distress. No wheezes, rales or ronchi noted.   Neurological: Alert and oriented. Cranial nerves II-XII intact. Coordination normal. +DTRs bilaterally.   BMET    Component Value Date/Time   NA 136 07/30/2012 0909   K 4.6 07/30/2012 0909   CL 104 07/30/2012 0909   CO2 25 07/30/2012 0909   GLUCOSE 85 07/30/2012 0909   BUN 10 07/30/2012 0909   CREATININE 0.9 07/30/2012 0909   CALCIUM 9.2 07/30/2012 0909    Lipid Panel     Component Value Date/Time   CHOL 121 07/30/2012 0909   TRIG 38.0 07/30/2012 0909   HDL 46.70 07/30/2012 0909   CHOLHDL 3 07/30/2012 0909   VLDL 7.6 07/30/2012 0909   LDLCALC 67 07/30/2012 0909    CBC    Component Value Date/Time   WBC 2.9* 09/15/2012 4098  RBC 4.14 09/15/2012 0944   HGB 12.6 09/15/2012 0944   HCT 37.9 09/15/2012 0944   PLT 175.0 09/15/2012 0944   MCV 91.7 09/15/2012 0944   MCHC 33.1 09/15/2012 0944   RDW 13.1 09/15/2012 0944   LYMPHSABS 1.1 09/15/2012 0944   MONOABS 0.3 09/15/2012 0944   EOSABS 0.1 09/15/2012 0944   BASOSABS 0.0 09/15/2012 0944    Hgb A1C No results found for this basename: HGBA1C        Assessment & Plan:   Preventricular Contraction:  ECG shows occasional PVC She is not symptomatic We will check her electrolytes today  RTC as needed

## 2013-11-11 NOTE — Patient Instructions (Signed)
Premature Ventricular Contraction Premature ventricular contraction (PVC) is an irregularity of the heart rhythm involving extra or skipped heartbeats. In some cases, they may occur without obvious cause or heart disease. Other times, they can be caused by an electrolyte change in the blood. These need to be corrected. They can also be seen when there is not enough oxygen going to the heart. A common cause of this is plaque or cholesterol buildup. This buildup decreases the blood supply to the heart. In addition, extra beats may be caused or aggravated by:  Excessive smoking.  Alcohol consumption.  Caffeine.  Certain medications  Some street drugs. SYMPTOMS   The sensation of feeling your heart skipping a beat (palpitations).  In many cases, the person may have no symptoms. SIGNS AND TESTS   A physical examination may show an occasional irregularity, but if the PVC beats do not happen often, they may not be found on physical exam.  Blood pressure is usually normal.  Other tests that may find extra beats of the heart are:  An EKG (electrocardiogram)  A Holter monitor which can monitor your heart over longer periods of time  An Angiogram (study of the heart arteries). TREATMENT  Usually extra heartbeats do not need treatment. The condition is treated only if symptoms are severe or if extra beats are very frequent or are causing problems. An underlying cause, if discovered, may also require treatment.  Treatment may also be needed if there may be a risk for other more serious cardiac arrhythmias.  PREVENTION   Moderation in caffeine, alcohol, and tobacco use may reduce the risk of ectopic heartbeats in some people.  Exercise often helps people who lead a sedentary (inactive) lifestyle. PROGNOSIS  PVC heartbeats are generally harmless and do not need treatment.  RISKS AND COMPLICATIONS   Ventricular tachycardia (occasionally).  There usually are no complications.  Other  arrhythmias (occasionally). SEEK IMMEDIATE MEDICAL CARE IF:   You feel palpitations that are frequent or continual.  You develop chest pain or other problems such as shortness of breath, sweating, or nausea and vomiting.  You become light-headed or faint (pass out).  You get worse or do not improve with treatment. Document Released: 07/05/2004 Document Revised: 02/10/2012 Document Reviewed: 01/15/2008 Exit[employer] Patient Information 2014 [employer].  

## 2013-11-11 NOTE — Progress Notes (Signed)
Pre-visit discussion using our clinic review tool. No additional management support is needed unless otherwise documented below in the visit note.  

## 2013-11-12 LAB — BASIC METABOLIC PANEL
BUN: 12 mg/dL (ref 6–23)
CO2: 29 mEq/L (ref 19–32)
Calcium: 9.3 mg/dL (ref 8.4–10.5)
Chloride: 102 mEq/L (ref 96–112)
Creatinine, Ser: 0.8 mg/dL (ref 0.4–1.2)
GFR: 80.19 mL/min (ref >60.00)
Glucose, Bld: 78 mg/dL (ref 70–99)
Potassium: 4.2 mEq/L (ref 3.5–5.1)
Sodium: 137 mEq/L (ref 135–145)

## 2013-11-12 LAB — MAGNESIUM: Magnesium: 2 mg/dL (ref 1.5–2.5)

## 2013-11-16 ENCOUNTER — Ambulatory Visit
Admission: RE | Admit: 2013-11-16 | Discharge: 2013-11-16 | Disposition: A | Discharge status: Home / Self Care | Payer: 59 | Source: Ambulatory Visit

## 2013-11-16 DIAGNOSIS — Z1231 Encounter for screening mammogram for malignant neoplasm of breast: Secondary | ICD-10-CM

## 2013-11-16 LAB — CBC CANCER CENTER
Basophil #: 0 x10 3/mm (ref 0.0–0.1)
Basophil %: 1.1 %
Eosinophil #: 0.1 x10 3/mm (ref 0.0–0.7)
Eosinophil %: 3.3 %
HCT: 38.8 % (ref 35.0–47.0)
HGB: 13 g/dL (ref 12.0–16.0)
Lymphocyte #: 1.1 x10 3/mm (ref 1.0–3.6)
Lymphocyte %: 36.2 %
MCH: 30.1 pg (ref 26.0–34.0)
MCHC: 33.5 g/dL (ref 32.0–36.0)
MCV: 90 fL (ref 80–100)
Monocyte #: 0.3 x10 3/mm (ref 0.2–0.9)
Monocyte %: 8.6 %
Neutrophil #: 1.6 x10 3/mm (ref 1.4–6.5)
Neutrophil %: 50.8 %
Platelet: 180 x10 3/mm (ref 150–440)
RBC: 4.32 10*6/uL (ref 3.80–5.20)
RDW: 12.8 % (ref 11.5–14.5)
WBC: 3.1 x10 3/mm — ABNORMAL LOW (ref 3.6–11.0)

## 2013-12-02 ENCOUNTER — Ambulatory Visit: Payer: Self-pay | Admitting: Oncology

## 2013-12-02 ENCOUNTER — Ambulatory Visit: Payer: Self-pay | Admitting: Hematology and Oncology

## 2014-01-02 ENCOUNTER — Ambulatory Visit: Payer: Self-pay | Admitting: Hematology and Oncology

## 2014-01-31 ENCOUNTER — Ambulatory Visit: Payer: Self-pay | Admitting: Hematology and Oncology

## 2014-03-02 ENCOUNTER — Ambulatory Visit: Payer: Self-pay | Admitting: Hematology and Oncology

## 2014-04-01 ENCOUNTER — Ambulatory Visit: Payer: Self-pay | Admitting: Hematology and Oncology

## 2014-05-02 ENCOUNTER — Ambulatory Visit: Payer: Self-pay | Admitting: Hematology and Oncology

## 2014-05-17 LAB — CBC CANCER CENTER
Basophil #: 0 x10 3/mm (ref 0.0–0.1)
Basophil %: 1.1 %
Eosinophil #: 0.1 x10 3/mm (ref 0.0–0.7)
Eosinophil %: 3.9 %
HCT: 39.1 % (ref 35.0–47.0)
HGB: 13.2 g/dL (ref 12.0–16.0)
Lymphocyte #: 1.4 x10 3/mm (ref 1.0–3.6)
Lymphocyte %: 41 %
MCH: 30.5 pg (ref 26.0–34.0)
MCHC: 33.8 g/dL (ref 32.0–36.0)
MCV: 90 fL (ref 80–100)
Monocyte #: 0.3 x10 3/mm (ref 0.2–0.9)
Monocyte %: 7.8 %
Neutrophil #: 1.5 x10 3/mm (ref 1.4–6.5)
Neutrophil %: 46.2 %
Platelet: 203 x10 3/mm (ref 150–440)
RBC: 4.33 10*6/uL (ref 3.80–5.20)
RDW: 12.4 % (ref 11.5–14.5)
WBC: 3.3 x10 3/mm — ABNORMAL LOW (ref 3.6–11.0)

## 2014-06-01 ENCOUNTER — Ambulatory Visit: Payer: Self-pay | Admitting: Hematology and Oncology

## 2014-10-25 ENCOUNTER — Encounter: Payer: Self-pay | Admitting: Family Medicine

## 2014-10-25 ENCOUNTER — Ambulatory Visit (INDEPENDENT_AMBULATORY_CARE_PROVIDER_SITE_OTHER): Payer: 59 | Admitting: Family Medicine

## 2014-10-25 VITALS — BP 116/70 | HR 79 | Temp 97.8°F | Ht 64.75 in | Wt 131.8 lb

## 2014-10-25 DIAGNOSIS — Z0000 Encounter for general adult medical examination without abnormal findings: Secondary | ICD-10-CM

## 2014-10-25 NOTE — Patient Instructions (Signed)
Great to see you. Happy Holidays! Please make a lab appointment on your way out for Monday.

## 2014-10-25 NOTE — Assessment & Plan Note (Signed)
Reviewed preventive care protocols, scheduled due services, and updated immunizations Discussed nutrition, exercise, diet, and healthy lifestyle.  Orders Placed This Encounter  Procedures  . CBC with Differential  . Comprehensive metabolic panel  . Lipid panel  . TSH    

## 2014-10-25 NOTE — Progress Notes (Signed)
History of Present Illness:  43 yo pleasant female here for CPX.  Mammogram 11/17/13 Had pap smear this past summer. Periods have been more regular lately.  Has no complaints today. Lab Results  Component Value Date   CHOL 121 07/30/2012   HDL 46.70 07/30/2012   LDLCALC 67 07/30/2012   TRIG 38.0 07/30/2012   CHOLHDL 3 07/30/2012   Lab Results  Component Value Date   CREATININE 0.8 11/11/2013   Lab Results  Component Value Date   WBC 2.9* 09/15/2012   HGB 12.6 09/15/2012   HCT 37.9 09/15/2012   MCV 91.7 09/15/2012   PLT 175.0 09/15/2012   Lab Results  Component Value Date   TSH 1.02 11/16/2012     Overall, doing well.  Enjoying her new job.     Patient Active Problem List   Diagnosis Date Noted  . Well woman exam (no gynecological exam) 10/25/2014  . UNSPECIFIED VITAMIN D DEFICIENCY 07/27/2010  . ANEMIA-NOS 07/27/2010  . HYPERTHYROIDISM, HX OF 07/27/2010   Past Medical History  Diagnosis Date  . Anemia   . Vitamin D deficiency   . Gluten intolerance    Past Surgical History  Procedure Laterality Date  . Tubal ligation  06/12/2001   History  Substance Use Topics  . Smoking status: Never Smoker   . Smokeless tobacco: Not on file  . Alcohol Use: Yes   Family History  Problem Relation Age of Onset  . Cancer Mother     thyroid   Allergies  Allergen Reactions  . Ciprofloxacin    No current outpatient prescriptions on file prior to visit.   No current facility-administered medications on file prior to visit.     The PMH, PSH, Social History, Family History, Medications, and allergies [facility], and have been updated if relevant.   Review of Systems  See HPI  Patient reports no  vision/ hearing changes,anorexia, weight change, fever ,adenopathy, persistant / recurrent hoarseness, swallowing issues, chest pain, edema,persistant / recurrent cough, hemoptysis, dyspnea(rest, exertional, paroxysmal nocturnal), gastrointestinal   bleeding (melena, rectal bleeding), abdominal pain, excessive heart burn, GU symptoms(dysuria, hematuria, pyuria, voiding/incontinence  Issues) syncope, focal weakness, severe memory loss, concerning skin lesions, depression, anxiety, abnormal bruising/bleeding, major joint swelling, breast masses or abnormal vaginal bleeding.     Physical Exam  BP 116/70 mmHg  Pulse 79  Temp(Src) 97.8 F (36.6 C) (Oral)  Ht 5' 4.75" (1.645 m)  Wt 131 lb 12 oz (59.761 kg)  BMI 22.08 kg/m2  SpO2 98%  LMP 10/04/2014 (Within Days)  General: alert, well-developed, and well-nourished.  Head: normocephalic.  Eyes: vision grossly intact and pupils equal.  Ears: R ear normal and L ear normal.  Nose: no external deformity.  Mouth: good dentition.  Neck: No deformities, masses, or tenderness noted.  Lungs: Normal respiratory effort, chest expands symmetrically. Lungs are clear to auscultation, no crackles or wheezes.  Heart: Normal rate and regular rhythm. S1 and S2 normal without gallop, murmur, click, rub or other extra sounds.  Abdomen: Bowel sounds positive,abdomen soft and non-tender without masses, organomegaly or hernias noted.  Msk: No deformity or scoliosis noted of thoracic or lumbar spine.  Extremities: no edema  Neurologic: alert & oriented X3, strength normal in all extremities, and gait normal.  Skin: Intact without suspicious lesions or rashes  Psych: Cognition and judgment appear intact. Alert and cooperative with normal attention span and concentration. No apparent delusions, illusions, hallucinations

## 2014-10-25 NOTE — Progress Notes (Signed)
Pre visit review using our clinic review tool, if applicable. No additional management support is needed unless otherwise documented below in the visit note. 

## 2014-10-31 ENCOUNTER — Other Ambulatory Visit (INDEPENDENT_AMBULATORY_CARE_PROVIDER_SITE_OTHER): Payer: 59

## 2014-10-31 ENCOUNTER — Encounter: Payer: Self-pay | Admitting: *Deleted

## 2014-10-31 DIAGNOSIS — Z0000 Encounter for general adult medical examination without abnormal findings: Secondary | ICD-10-CM

## 2014-10-31 LAB — COMPREHENSIVE METABOLIC PANEL
ALT: 24 U/L (ref 0–35)
AST: 24 U/L (ref 0–37)
Albumin: 4 g/dL (ref 3.5–5.2)
Alkaline Phosphatase: 64 U/L (ref 39–117)
BUN: 12 mg/dL (ref 6–23)
CO2: 24 mEq/L (ref 19–32)
Calcium: 8.9 mg/dL (ref 8.4–10.5)
Chloride: 103 mEq/L (ref 96–112)
Creatinine, Ser: 0.8 mg/dL (ref 0.4–1.2)
GFR: 87.04 mL/min (ref >60.00)
Glucose, Bld: 82 mg/dL (ref 70–99)
Potassium: 4.3 mEq/L (ref 3.5–5.1)
Sodium: 135 mEq/L (ref 135–145)
Total Bilirubin: 0.5 mg/dL (ref 0.2–1.2)
Total Protein: 7.3 g/dL (ref 6.0–8.3)

## 2014-10-31 LAB — CBC WITH DIFFERENTIAL/PLATELET
Basophils Absolute: 0 10*3/uL (ref 0.0–0.1)
Basophils Relative: 0.7 % (ref 0.0–3.0)
Eosinophils Absolute: 0.1 10*3/uL (ref 0.0–0.7)
Eosinophils Relative: 2.9 % (ref 0.0–5.0)
HCT: 37.5 % (ref 36.0–46.0)
Hemoglobin: 12.5 g/dL (ref 12.0–15.0)
Lymphocytes Relative: 40 % (ref 12.0–46.0)
Lymphs Abs: 1.2 10*3/uL (ref 0.7–4.0)
MCHC: 33.4 g/dL (ref 30.0–36.0)
MCV: 89.2 fl (ref 78.0–100.0)
Monocytes Absolute: 0.2 10*3/uL (ref 0.1–1.0)
Monocytes Relative: 8 % (ref 3.0–12.0)
Neutro Abs: 1.5 10*3/uL (ref 1.4–7.7)
Neutrophils Relative %: 48.4 % (ref 43.0–77.0)
Platelets: 240 10*3/uL (ref 150.0–400.0)
RBC: 4.2 Mil/uL (ref 3.87–5.11)
RDW: 12.9 % (ref 11.5–15.5)
WBC: 3.1 10*3/uL — ABNORMAL LOW (ref 4.0–10.5)

## 2014-10-31 LAB — LIPID PANEL
Cholesterol: 123 mg/dL (ref 0–200)
HDL: 33.1 mg/dL — ABNORMAL LOW (ref >39.00)
LDL Cholesterol: 74 mg/dL (ref 0–99)
NonHDL: 89.9
Total CHOL/HDL Ratio: 4
Triglycerides: 82 mg/dL (ref 0.0–149.0)
VLDL: 16.4 mg/dL (ref 0.0–40.0)

## 2014-10-31 LAB — TSH: TSH: 1.18 u[IU]/mL (ref 0.35–4.50)

## 2014-11-17 ENCOUNTER — Ambulatory Visit: Payer: Self-pay | Admitting: Hematology and Oncology

## 2014-11-17 LAB — CBC CANCER CENTER
Basophil #: 0 x10 3/mm (ref 0.0–0.1)
Basophil %: 0.7 %
Eosinophil #: 0.1 x10 3/mm (ref 0.0–0.7)
Eosinophil %: 2.6 %
HCT: 38.1 % (ref 35.0–47.0)
HGB: 12.7 g/dL (ref 12.0–16.0)
Lymphocyte #: 1.5 x10 3/mm (ref 1.0–3.6)
Lymphocyte %: 36.6 %
MCH: 29.4 pg (ref 26.0–34.0)
MCHC: 33.3 g/dL (ref 32.0–36.0)
MCV: 88 fL (ref 80–100)
Monocyte #: 0.3 x10 3/mm (ref 0.2–0.9)
Monocyte %: 8.6 %
Neutrophil #: 2.1 x10 3/mm (ref 1.4–6.5)
Neutrophil %: 51.5 %
Platelet: 184 x10 3/mm (ref 150–440)
RBC: 4.33 10*6/uL (ref 3.80–5.20)
RDW: 12.8 % (ref 11.5–14.5)
WBC: 4 x10 3/mm (ref 3.6–11.0)

## 2014-11-17 LAB — COMPREHENSIVE METABOLIC PANEL
Albumin: 4 g/dL (ref 3.4–5.0)
Alkaline Phosphatase: 56 U/L
Anion Gap: 7 (ref 7–16)
BUN: 12 mg/dL (ref 7–18)
Bilirubin,Total: 0.5 mg/dL (ref 0.2–1.0)
Calcium, Total: 8.6 mg/dL (ref 8.5–10.1)
Chloride: 103 mmol/L (ref 98–107)
Co2: 28 mmol/L (ref 21–32)
Creatinine: 0.9 mg/dL (ref 0.60–1.30)
EGFR (African American): >60
EGFR (Non-African Amer.): >60
Glucose: 88 mg/dL (ref 65–99)
Osmolality: 275 (ref 275–301)
Potassium: 3.9 mmol/L (ref 3.5–5.1)
SGOT(AST): 15 U/L (ref 15–37)
SGPT (ALT): 18 U/L
Sodium: 138 mmol/L (ref 136–145)
Total Protein: 7.6 g/dL (ref 6.4–8.2)

## 2014-12-02 ENCOUNTER — Ambulatory Visit: Payer: Self-pay | Admitting: Hematology and Oncology

## 2015-05-10 ENCOUNTER — Other Ambulatory Visit: Payer: Self-pay

## 2015-05-10 DIAGNOSIS — Z1231 Encounter for screening mammogram for malignant neoplasm of breast: Secondary | ICD-10-CM

## 2015-06-01 ENCOUNTER — Inpatient Hospital Stay: Payer: BLUE CROSS/BLUE SHIELD | Attending: Hematology and Oncology

## 2015-06-01 DIAGNOSIS — D519 Vitamin B12 deficiency anemia, unspecified: Secondary | ICD-10-CM

## 2015-06-01 DIAGNOSIS — Z79899 Other long term (current) drug therapy: Secondary | ICD-10-CM | POA: Insufficient documentation

## 2015-06-01 MED ORDER — CYANOCOBALAMIN 1000 MCG/ML IJ SOLN
1000.0000 ug | Freq: Once | INTRAMUSCULAR | Status: AC
  Administered 2015-06-01: 1000 ug via SUBCUTANEOUS
  Filled 2015-06-01: qty 1

## 2015-06-02 ENCOUNTER — Telehealth: Payer: Self-pay | Admitting: Family Medicine

## 2015-06-02 NOTE — Telephone Encounter (Signed)
Pt dropped off forms for work.  Please call pt at [phone] when ready to be picked up. Placing in Dr Elmer SowAron's inbox, thanks.

## 2015-06-13 NOTE — Telephone Encounter (Signed)
Lm on pts vm and informed her, per Dr Dayton MartesAron, OV required for completion of form

## 2015-06-19 ENCOUNTER — Ambulatory Visit
Admission: RE | Admit: 2015-06-19 | Discharge: 2015-06-19 | Disposition: A | Discharge status: Home / Self Care | Payer: BLUE CROSS/BLUE SHIELD | Source: Ambulatory Visit

## 2015-06-19 DIAGNOSIS — Z1231 Encounter for screening mammogram for malignant neoplasm of breast: Secondary | ICD-10-CM

## 2015-06-21 ENCOUNTER — Ambulatory Visit (INDEPENDENT_AMBULATORY_CARE_PROVIDER_SITE_OTHER): Payer: BLUE CROSS/BLUE SHIELD | Admitting: Family Medicine

## 2015-06-21 ENCOUNTER — Encounter: Payer: Self-pay | Admitting: Family Medicine

## 2015-06-21 VITALS — BP 106/66 | HR 74 | Temp 97.7°F | Ht 65.0 in | Wt 131.8 lb

## 2015-06-21 DIAGNOSIS — Z0289 Encounter for other administrative examinations: Secondary | ICD-10-CM | POA: Diagnosis not present

## 2015-06-21 NOTE — Progress Notes (Signed)
Pre visit review using our clinic review tool, if applicable. No additional management support is needed unless otherwise documented below in the visit note. 

## 2015-06-21 NOTE — Progress Notes (Signed)
   Subjective:   Patient ID: Shari Vasquez, female    DOB: 03/19/1971, 44 y.o.   MRN: [id number]  Shari Vasquez is a pleasant 44 y.o. year old female who presents to clinic today with school form  on 06/21/2015  HPI:  Needs form filled out stating that she is healthy to participate in her school's clinical rotations.  She has a copy of her immunizations from student health.  Form is asking to check her hearing and vision as well.  Current Outpatient Prescriptions on File Prior to Visit  Medication Sig Dispense Refill  . cyanocobalamin (,VITAMIN B-12,) 1000 MCG/ML injection Inject 1,000 mcg into the muscle every 30 (thirty) days.     No current facility-administered medications on file prior to visit.    Allergies  Allergen Reactions  . Ciprofloxacin     Past Medical History  Diagnosis Date  . Anemia   . Vitamin D deficiency   . Gluten intolerance     Past Surgical History  Procedure Laterality Date  . Tubal ligation  06/12/2001    Family History  Problem Relation Age of Onset  . Cancer Mother     thyroid    History   Social History  . Marital Status: Married    Spouse Name: N/A  . Number of Children: 2  . Years of Education: N/A   Occupational History  . Chiropractor    Social History Main Topics  . Smoking status: Never Smoker   . Smokeless tobacco: Not on file  . Alcohol Use: Yes  . Drug Use: No  . Sexual Activity: Not on file   Other Topics Concern  . Not on file   Social History Narrative   Religion affecting care-no immunizations   Regular exercise   The PMH, PSH, Social History, Family History, Medications, and allergies have been [facility], and have been updated if relevant.   Review of Systems  HENT: Negative.   Respiratory: Negative.   Cardiovascular: Negative.   Gastrointestinal: Negative.   Endocrine: Negative.   Genitourinary: Negative.   Musculoskeletal: Negative.   Skin: Negative.   Allergic/Immunologic: Negative.     Neurological: Negative.   Hematological: Negative.   Psychiatric/Behavioral: Negative.   All other systems reviewed and are negative.      Objective:    BP 106/66 mmHg  Pulse 74  Temp(Src) 97.7 F (36.5 C) (Oral)  Ht 5\' 5"  (1.651 m)  Wt 131 lb 12 oz (59.761 kg)  BMI 21.92 kg/m2  SpO2 98%  LMP 06/02/2015   Physical Exam  Constitutional: She is oriented to person, place, and time. She appears well-developed and well-nourished. No distress.  HENT:  Head: Normocephalic.  Eyes: Conjunctivae are normal.  Cardiovascular: Normal rate.   Pulmonary/Chest: Effort normal.  Abdominal: Soft.  Musculoskeletal: Normal range of motion.  Neurological: She is alert and oriented to person, place, and time. No cranial nerve deficit.  Skin: Skin is warm and dry.  Psychiatric: She has a normal mood and affect. Her behavior is normal. Judgment and thought content normal.  Nursing note and vitals reviewed.         Assessment & Plan:   Encounter for completion of form with patient No Follow-up on file.

## 2015-06-21 NOTE — Assessment & Plan Note (Signed)
Hearing and vision assessed. Form filled out clearing her to participate in clinical rotations and given back to pt at OV.

## 2015-06-29 ENCOUNTER — Ambulatory Visit: Payer: BLUE CROSS/BLUE SHIELD

## 2015-06-29 ENCOUNTER — Other Ambulatory Visit: Payer: Self-pay | Admitting: *Deleted

## 2015-06-29 ENCOUNTER — Inpatient Hospital Stay: Payer: BLUE CROSS/BLUE SHIELD | Attending: Hematology and Oncology

## 2015-06-29 DIAGNOSIS — Z79899 Other long term (current) drug therapy: Secondary | ICD-10-CM | POA: Insufficient documentation

## 2015-06-29 DIAGNOSIS — D519 Vitamin B12 deficiency anemia, unspecified: Secondary | ICD-10-CM | POA: Insufficient documentation

## 2015-06-29 DIAGNOSIS — E538 Deficiency of other specified B group vitamins: Secondary | ICD-10-CM

## 2015-06-29 MED ORDER — CYANOCOBALAMIN 1000 MCG/ML IJ SOLN
1000.0000 ug | Freq: Once | INTRAMUSCULAR | Status: AC
  Administered 2015-06-29: 1000 ug via INTRAMUSCULAR
  Filled 2015-06-29: qty 1

## 2015-08-01 ENCOUNTER — Ambulatory Visit: Payer: BLUE CROSS/BLUE SHIELD

## 2015-08-01 ENCOUNTER — Other Ambulatory Visit: Payer: BLUE CROSS/BLUE SHIELD

## 2015-08-03 ENCOUNTER — Inpatient Hospital Stay: Payer: BLUE CROSS/BLUE SHIELD | Attending: Hematology and Oncology

## 2015-08-03 ENCOUNTER — Inpatient Hospital Stay: Payer: BLUE CROSS/BLUE SHIELD

## 2015-08-03 DIAGNOSIS — D72819 Decreased white blood cell count, unspecified: Secondary | ICD-10-CM | POA: Insufficient documentation

## 2015-08-03 DIAGNOSIS — E538 Deficiency of other specified B group vitamins: Secondary | ICD-10-CM | POA: Insufficient documentation

## 2015-08-03 DIAGNOSIS — D519 Vitamin B12 deficiency anemia, unspecified: Secondary | ICD-10-CM

## 2015-08-03 DIAGNOSIS — Z79899 Other long term (current) drug therapy: Secondary | ICD-10-CM | POA: Diagnosis not present

## 2015-08-03 LAB — FOLATE: Folate: 4.4 ng/mL — ABNORMAL LOW (ref >5.9)

## 2015-08-03 MED ORDER — CYANOCOBALAMIN 1000 MCG/ML IJ SOLN
1000.0000 ug | Freq: Once | INTRAMUSCULAR | Status: AC
  Administered 2015-08-03: 1000 ug via INTRAMUSCULAR
  Filled 2015-08-03: qty 1

## 2015-08-22 ENCOUNTER — Encounter: Payer: Self-pay | Admitting: Hematology and Oncology

## 2015-08-22 ENCOUNTER — Encounter (INDEPENDENT_AMBULATORY_CARE_PROVIDER_SITE_OTHER): Payer: Self-pay

## 2015-08-22 ENCOUNTER — Inpatient Hospital Stay: Payer: BLUE CROSS/BLUE SHIELD

## 2015-08-22 ENCOUNTER — Other Ambulatory Visit: Payer: Self-pay

## 2015-08-22 ENCOUNTER — Inpatient Hospital Stay (HOSPITAL_BASED_OUTPATIENT_CLINIC_OR_DEPARTMENT_OTHER): Payer: BLUE CROSS/BLUE SHIELD | Admitting: Hematology and Oncology

## 2015-08-22 VITALS — BP 108/73 | HR 68 | Temp 97.9°F | Resp 18 | Ht 65.0 in | Wt 135.6 lb

## 2015-08-22 DIAGNOSIS — D519 Vitamin B12 deficiency anemia, unspecified: Secondary | ICD-10-CM

## 2015-08-22 DIAGNOSIS — E538 Deficiency of other specified B group vitamins: Secondary | ICD-10-CM | POA: Diagnosis not present

## 2015-08-22 DIAGNOSIS — D72819 Decreased white blood cell count, unspecified: Secondary | ICD-10-CM

## 2015-08-22 DIAGNOSIS — Z79899 Other long term (current) drug therapy: Secondary | ICD-10-CM | POA: Diagnosis not present

## 2015-08-22 LAB — CBC WITH DIFFERENTIAL/PLATELET
Basophils Absolute: 0 10*3/uL (ref 0–0.1)
Basophils Relative: 1 %
Eosinophils Absolute: 0.1 10*3/uL (ref 0–0.7)
Eosinophils Relative: 2 %
HCT: 38.3 % (ref 35.0–47.0)
Hemoglobin: 12.9 g/dL (ref 12.0–16.0)
Lymphocytes Relative: 33 %
Lymphs Abs: 1.4 10*3/uL (ref 1.0–3.6)
MCH: 29.8 pg (ref 26.0–34.0)
MCHC: 33.8 g/dL (ref 32.0–36.0)
MCV: 88.3 fL (ref 80.0–100.0)
Monocytes Absolute: 0.4 10*3/uL (ref 0.2–0.9)
Monocytes Relative: 9 %
Neutro Abs: 2.3 10*3/uL (ref 1.4–6.5)
Neutrophils Relative %: 55 %
Platelets: 224 10*3/uL (ref 150–440)
RBC: 4.34 MIL/uL (ref 3.80–5.20)
RDW: 13 % (ref 11.5–14.5)
WBC: 4.2 10*3/uL (ref 3.6–11.0)

## 2015-08-22 LAB — COMPREHENSIVE METABOLIC PANEL
ALT: 17 U/L (ref 14–54)
AST: 21 U/L (ref 15–41)
Albumin: 4.5 g/dL (ref 3.5–5.0)
Alkaline Phosphatase: 47 U/L (ref 38–126)
Anion gap: 7 (ref 5–15)
BUN: 16 mg/dL (ref 6–20)
CO2: 26 mmol/L (ref 22–32)
Calcium: 8.9 mg/dL (ref 8.9–10.3)
Chloride: 101 mmol/L (ref 101–111)
Creatinine, Ser: 0.86 mg/dL (ref 0.44–1.00)
GFR calc Af Amer: >60 mL/min (ref >60)
GFR calc non Af Amer: >60 mL/min (ref >60)
Glucose, Bld: 79 mg/dL (ref 65–99)
Potassium: 4.1 mmol/L (ref 3.5–5.1)
Sodium: 134 mmol/L — ABNORMAL LOW (ref 135–145)
Total Bilirubin: 0.7 mg/dL (ref 0.3–1.2)
Total Protein: 7.9 g/dL (ref 6.5–8.1)

## 2015-08-22 LAB — RETICULOCYTES
RBC.: 4.34 MIL/uL (ref 3.80–5.20)
Retic Count, Absolute: 47.7 10*3/uL (ref 19.0–183.0)
Retic Ct Pct: 1.1 % (ref 0.4–3.1)

## 2015-08-22 LAB — TSH: TSH: 1.271 u[IU]/mL (ref 0.350–4.500)

## 2015-08-22 LAB — FOLATE: Folate: 5.4 ng/mL — ABNORMAL LOW (ref >5.9)

## 2015-08-23 LAB — VITAMIN B12: Vitamin B-12: 338 pg/mL (ref 180–914)

## 2015-08-29 ENCOUNTER — Inpatient Hospital Stay: Payer: BLUE CROSS/BLUE SHIELD

## 2015-08-29 DIAGNOSIS — D519 Vitamin B12 deficiency anemia, unspecified: Secondary | ICD-10-CM

## 2015-08-29 DIAGNOSIS — D72819 Decreased white blood cell count, unspecified: Secondary | ICD-10-CM | POA: Diagnosis not present

## 2015-08-29 MED ORDER — CYANOCOBALAMIN 1000 MCG/ML IJ SOLN
1000.0000 ug | Freq: Once | INTRAMUSCULAR | Status: AC
  Administered 2015-08-29: 1000 ug via INTRAMUSCULAR
  Filled 2015-08-29: qty 1

## 2015-08-30 ENCOUNTER — Ambulatory Visit (INDEPENDENT_AMBULATORY_CARE_PROVIDER_SITE_OTHER): Payer: BLUE CROSS/BLUE SHIELD | Admitting: *Deleted

## 2015-08-30 DIAGNOSIS — Z111 Encounter for screening for respiratory tuberculosis: Secondary | ICD-10-CM | POA: Diagnosis not present

## 2015-09-01 LAB — TB SKIN TEST
Induration: 0 mm
TB Skin Test: NEGATIVE

## 2015-09-27 ENCOUNTER — Inpatient Hospital Stay: Payer: BLUE CROSS/BLUE SHIELD

## 2015-09-27 ENCOUNTER — Inpatient Hospital Stay: Payer: BLUE CROSS/BLUE SHIELD | Attending: Hematology and Oncology

## 2015-09-27 ENCOUNTER — Other Ambulatory Visit: Payer: Self-pay

## 2015-09-27 DIAGNOSIS — D519 Vitamin B12 deficiency anemia, unspecified: Secondary | ICD-10-CM

## 2015-09-27 DIAGNOSIS — E538 Deficiency of other specified B group vitamins: Secondary | ICD-10-CM | POA: Diagnosis not present

## 2015-09-27 DIAGNOSIS — Z79899 Other long term (current) drug therapy: Secondary | ICD-10-CM | POA: Diagnosis not present

## 2015-09-27 DIAGNOSIS — D72819 Decreased white blood cell count, unspecified: Secondary | ICD-10-CM | POA: Diagnosis present

## 2015-09-27 LAB — FOLATE: Folate: 25 ng/mL (ref >5.9)

## 2015-09-27 MED ORDER — CYANOCOBALAMIN 1000 MCG/ML IJ SOLN
1000.0000 ug | Freq: Once | INTRAMUSCULAR | Status: AC
  Administered 2015-09-27: 1000 ug via INTRAMUSCULAR
  Filled 2015-09-27: qty 1

## 2015-10-31 ENCOUNTER — Other Ambulatory Visit: Payer: Self-pay | Admitting: Hematology and Oncology

## 2015-10-31 ENCOUNTER — Inpatient Hospital Stay: Payer: BLUE CROSS/BLUE SHIELD | Attending: Hematology and Oncology

## 2015-10-31 DIAGNOSIS — E538 Deficiency of other specified B group vitamins: Secondary | ICD-10-CM | POA: Diagnosis not present

## 2015-10-31 DIAGNOSIS — D519 Vitamin B12 deficiency anemia, unspecified: Secondary | ICD-10-CM

## 2015-10-31 DIAGNOSIS — Z79899 Other long term (current) drug therapy: Secondary | ICD-10-CM | POA: Diagnosis not present

## 2015-10-31 DIAGNOSIS — D72819 Decreased white blood cell count, unspecified: Secondary | ICD-10-CM | POA: Insufficient documentation

## 2015-10-31 MED ORDER — CYANOCOBALAMIN 1000 MCG/ML IJ SOLN
1000.0000 ug | Freq: Once | INTRAMUSCULAR | Status: AC
  Administered 2015-10-31: 1000 ug via INTRAMUSCULAR
  Filled 2015-10-31: qty 1

## 2015-11-21 ENCOUNTER — Other Ambulatory Visit: Payer: Self-pay

## 2015-11-21 ENCOUNTER — Ambulatory Visit: Payer: Self-pay | Admitting: Hematology and Oncology

## 2015-11-21 ENCOUNTER — Ambulatory Visit: Payer: BLUE CROSS/BLUE SHIELD

## 2015-11-27 ENCOUNTER — Other Ambulatory Visit: Payer: Self-pay | Admitting: Hematology and Oncology

## 2015-11-28 ENCOUNTER — Inpatient Hospital Stay: Payer: BLUE CROSS/BLUE SHIELD | Attending: Hematology and Oncology

## 2015-11-28 DIAGNOSIS — Z79899 Other long term (current) drug therapy: Secondary | ICD-10-CM | POA: Insufficient documentation

## 2015-11-28 DIAGNOSIS — D72819 Decreased white blood cell count, unspecified: Secondary | ICD-10-CM | POA: Diagnosis not present

## 2015-11-28 DIAGNOSIS — E538 Deficiency of other specified B group vitamins: Secondary | ICD-10-CM | POA: Insufficient documentation

## 2015-11-28 DIAGNOSIS — D519 Vitamin B12 deficiency anemia, unspecified: Secondary | ICD-10-CM

## 2015-11-28 MED ORDER — CYANOCOBALAMIN 1000 MCG/ML IJ SOLN
1000.0000 ug | Freq: Once | INTRAMUSCULAR | Status: AC
  Administered 2015-11-28: 1000 ug via INTRAMUSCULAR
  Filled 2015-11-28: qty 1

## 2015-12-28 ENCOUNTER — Inpatient Hospital Stay: Payer: 59 | Attending: Hematology and Oncology

## 2015-12-28 ENCOUNTER — Other Ambulatory Visit: Payer: Self-pay | Admitting: Hematology and Oncology

## 2015-12-28 DIAGNOSIS — D72819 Decreased white blood cell count, unspecified: Secondary | ICD-10-CM | POA: Diagnosis not present

## 2015-12-28 DIAGNOSIS — Z79899 Other long term (current) drug therapy: Secondary | ICD-10-CM | POA: Insufficient documentation

## 2015-12-28 DIAGNOSIS — E538 Deficiency of other specified B group vitamins: Secondary | ICD-10-CM | POA: Insufficient documentation

## 2015-12-28 DIAGNOSIS — D519 Vitamin B12 deficiency anemia, unspecified: Secondary | ICD-10-CM

## 2015-12-28 MED ORDER — CYANOCOBALAMIN 1000 MCG/ML IJ SOLN
1000.0000 ug | Freq: Once | INTRAMUSCULAR | Status: AC
  Administered 2015-12-28: 1000 ug via INTRAMUSCULAR
  Filled 2015-12-28: qty 1

## 2016-01-14 NOTE — Progress Notes (Signed)
[place] Clinic day:  08/22/2015  Chief Complaint: Shari Vasquez is a 45 y.o. female with leukopenia who is seen for reassessment.  HPI:  The patient notes a history of a low blood count for the past 2 years. She denies any history of infection. Available notes reveal a white count of 2900 on 09/15/2012 with a hemoglobin of 12.3 and platelets between 150,000 and 170,000.  She notes a gluten intolerance. 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  She has been on B12 injections for the past one and a half.  As part of her initial work-up, she had an ANA and RF (both negative)  Available labs on 05/17/2014 included a hematocrit 39.1, hemoglobin 13.2, MCV 90, platelets 203,000, white count 3300 with an ANC of 1500. Labs on 11/17/2014 included a hematocrit 38.1, hemoglobin 12.7, MCV 88, platelets 284,000, white count 4000 with an ANC of 2100.  Symptomatically, the patient notes a gluten intolerance. She notes her diarrhea is random in typically "runs its course" and will "normalize in 24 hours". She takes Imodium sometimes. She denies any nausea or vomiting. She denies any weight loss.  She has had no prior colonoscopy.  Past Medical History  Diagnosis Date  . Anemia   . Vitamin D deficiency   . Gluten intolerance     Past Surgical History  Procedure Laterality Date  . Tubal ligation  06/12/2001    Family History  Problem Relation Age of Onset  . Cancer Mother     thyroid    Social History:  reports that she has never smoked. She does not have any smokeless tobacco history on file. She reports that she does not drink alcohol or use illicit drugs.  The patient is alone today.  Allergies:  Allergies  Allergen Reactions  . Ciprofloxacin     Current Medications: Current Outpatient Prescriptions  Medication Sig Dispense Refill  . cyanocobalamin (,VITAMIN B-12,) 1000 MCG/ML injection Inject 1,000 mcg  into the muscle every 30 (thirty) days.     No current facility-administered medications for this visit.    Review of Systems:  GENERAL:  Feels fine.  No fevers, sweats or weight loss. PERFORMANCE STATUS (ECOG):  0 HEENT:  No visual changes, runny nose, sore throat, mouth sores or tenderness. Lungs: No shortness of breath or cough.  No hemoptysis. Cardiac:  No chest pain, palpitations, orthopnea, or PND. GI:  Gluten intolerance.  Random diarrhea.  No nausea, vomiting, constipation, melena or hematochezia. GU:  No urgency, frequency, dysuria, or hematuria. Musculoskeletal:  No back pain.  No joint pain.  No muscle tenderness. Extremities:  No pain or swelling. Skin:  No rashes or skin changes. Neuro:  No headache, numbness or weakness, balance or coordination issues. Endocrine:  No diabetes, thyroid issues, hot flashes or night sweats. Psych:  No mood changes, depression or anxiety. Pain:  No focal pain. Review of systems:  All other systems reviewed and found to be negative.  Physical Exam: Blood pressure 108/73, pulse 68, temperature 97.9 F (36.6 C), temperature source Tympanic, resp. rate 18, height _0  (1.651 m), weight 135 lb 9.3 oz (61.5 kg), SpO2 100 %. GENERAL:  Thin woman sitting comfortably in the exam room in no acute distress. MENTAL STATUS:  Alert and oriented to person, place and time. HEAD:  Dark short hair.  Normocephalic, atraumatic, face symmetric, no Cushingoid features. EYES:  Brown eyes.  Pupils equal round and reactive to light  and accomodation.  No conjunctivitis or scleral icterus. ENT:  Oropharynx clear without lesion.  Tongue normal. Mucous membranes moist.  RESPIRATORY:  Clear to auscultation without rales, wheezes or rhonchi. CARDIOVASCULAR:  Regular rate and rhythm without murmur, rub or gallop. ABDOMEN:  Soft, non-tender, with active bowel sounds, and no hepatosplenomegaly.  No masses. SKIN:  No rashes, ulcers or lesions. EXTREMITIES: No edema, no  skin discoloration or tenderness.  No palpable cords. LYMPH NODES: No palpable cervical, supraclavicular, axillary or inguinal adenopathy  NEUROLOGICAL: Unremarkable. PSYCH:  Appropriate.  Appointment on 08/22/2015  Component Date Value Ref Range Status  . WBC 08/22/2015 4.2  3.6 - 11.0 K/uL Final  . RBC 08/22/2015 4.34  3.80 - 5.20 MIL/uL Final  . Hemoglobin 08/22/2015 12.9  12.0 - 16.0 g/dL Final  . HCT 08/22/2015 38.3  35.0 - 47.0 % Final  . MCV 08/22/2015 88.3  80.0 - 100.0 fL Final  . MCH 08/22/2015 29.8  26.0 - 34.0 pg Final  . MCHC 08/22/2015 33.8  32.0 - 36.0 g/dL Final  . RDW 08/22/2015 13.0  11.5 - 14.5 % Final  . Platelets 08/22/2015 224  150 - 440 K/uL Final  . Neutrophils Relative % 08/22/2015 55   Final  . Neutro Abs 08/22/2015 2.3  1.4 - 6.5 K/uL Final  . Lymphocytes Relative 08/22/2015 33   Final  . Lymphs Abs 08/22/2015 1.4  1.0 - 3.6 K/uL Final  . Monocytes Relative 08/22/2015 9   Final  . Monocytes Absolute 08/22/2015 0.4  0.2 - 0.9 K/uL Final  . Eosinophils Relative 08/22/2015 2   Final  . Eosinophils Absolute 08/22/2015 0.1  0 - 0.7 K/uL Final  . Basophils Relative 08/22/2015 1   Final  . Basophils Absolute 08/22/2015 0.0  0 - 0.1 K/uL Final  . Sodium 08/22/2015 134* 135 - 145 mmol/L Final  . Potassium 08/22/2015 4.1  3.5 - 5.1 mmol/L Final  . Chloride 08/22/2015 101  101 - 111 mmol/L Final  . CO2 08/22/2015 26  22 - 32 mmol/L Final  . Glucose, Bld 08/22/2015 79  65 - 99 mg/dL Final  . BUN 08/22/2015 16  6 - 20 mg/dL Final  . Creatinine, Ser 08/22/2015 0.86  0.44 - 1.00 mg/dL Final  . Calcium 08/22/2015 8.9  8.9 - 10.3 mg/dL Final  . Total Protein 08/22/2015 7.9  6.5 - 8.1 g/dL Final  . Albumin 08/22/2015 4.5  3.5 - 5.0 g/dL Final  . AST 08/22/2015 21  15 - 41 U/L Final  . ALT 08/22/2015 17  14 - 54 U/L Final  . Alkaline Phosphatase 08/22/2015 47  38 - 126 U/L Final  . Total Bilirubin 08/22/2015 0.7  0.3 - 1.2 mg/dL Final  . GFR calc non Af Amer 08/22/2015  >60  >60 mL/min Final  . GFR calc Af Amer 08/22/2015 >60  >60 mL/min Final   Comment: (NOTE) The eGFR has been calculated using the CKD EPI equation. This calculation has not been validated in all clinical situations. eGFR's persistently <60 mL/min signify possible Chronic Kidney Disease.   . Anion gap 08/22/2015 7  5 - 15 Final  . Vitamin B-12 08/22/2015 338  180 - 914 pg/mL Final   Comment: (NOTE) This assay is not validated for testing neonatal or myeloproliferative syndrome specimens for Vitamin B12 levels. Performed at [facility]   . Folate 08/22/2015 5.4* >5.9 ng/mL Final  . TSH 08/22/2015 1.271  0.350 - 4.500 uIU/mL Final  . Retic Ct Pct 08/22/2015  1.1  0.4 - 3.1 % Final  . RBC. 08/22/2015 4.34  3.80 - 5.20 MIL/uL Final  . Retic Count, Manual 08/22/2015 47.7  19.0 - 183.0 K/uL Final    Assessment:  Shari Vasquez is a 45 y.o. 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  WBC was 2900 in 09/15/2012.  She has had no infections.  WBC is normal today.  She has a history of B12 deficiency.  She has received monthly B12 for 1 1/2 years (last 08/03/2015).  Symptomatically, she feels good.  She has intermittent diarrhea.  Exam is unremarkable.  Folate is low.  Plan: 1. Review entire medical history, diagnosis of leukopenia and B12 deficiency, and subsequent normalization of counts. 2. Labs today:  CBC with diff, CMP, B12, folate, TSH, retic 3. Continue B12 monthly (next due 08/29/2015). 4. Begin folic acid 1 mg a day.  Follow-up level 09/27/2015. 5. RTC on 09/27/2015 for B12 injections and lab (folate level). 6. RTC in 6 months for MD assessment and labs (CBC with diff, ferritin, B12, folate).   Lequita Asal, MD  08/22/2015

## 2016-01-25 ENCOUNTER — Inpatient Hospital Stay: Payer: 59 | Attending: Hematology and Oncology

## 2016-01-25 ENCOUNTER — Other Ambulatory Visit: Payer: Self-pay | Admitting: Hematology and Oncology

## 2016-01-25 DIAGNOSIS — E538 Deficiency of other specified B group vitamins: Secondary | ICD-10-CM | POA: Insufficient documentation

## 2016-01-25 DIAGNOSIS — Z79899 Other long term (current) drug therapy: Secondary | ICD-10-CM | POA: Insufficient documentation

## 2016-01-25 DIAGNOSIS — D72819 Decreased white blood cell count, unspecified: Secondary | ICD-10-CM | POA: Diagnosis not present

## 2016-01-25 DIAGNOSIS — D519 Vitamin B12 deficiency anemia, unspecified: Secondary | ICD-10-CM

## 2016-01-25 DIAGNOSIS — K9041 Non-celiac gluten sensitivity: Secondary | ICD-10-CM | POA: Diagnosis not present

## 2016-01-25 MED ORDER — CYANOCOBALAMIN 1000 MCG/ML IJ SOLN
1000.0000 ug | Freq: Once | INTRAMUSCULAR | Status: AC
  Administered 2016-01-25: 1000 ug via INTRAMUSCULAR
  Filled 2016-01-25: qty 1

## 2016-02-22 ENCOUNTER — Ambulatory Visit: Payer: Self-pay | Admitting: Hematology and Oncology

## 2016-02-22 ENCOUNTER — Ambulatory Visit: Payer: BLUE CROSS/BLUE SHIELD

## 2016-02-22 ENCOUNTER — Other Ambulatory Visit: Payer: Self-pay

## 2016-02-28 ENCOUNTER — Other Ambulatory Visit: Payer: Self-pay

## 2016-02-28 DIAGNOSIS — E538 Deficiency of other specified B group vitamins: Secondary | ICD-10-CM

## 2016-02-29 ENCOUNTER — Encounter: Payer: Self-pay | Admitting: Hematology and Oncology

## 2016-02-29 ENCOUNTER — Inpatient Hospital Stay: Payer: 59

## 2016-02-29 ENCOUNTER — Ambulatory Visit: Payer: 59 | Admitting: Hematology and Oncology

## 2016-02-29 ENCOUNTER — Inpatient Hospital Stay (HOSPITAL_BASED_OUTPATIENT_CLINIC_OR_DEPARTMENT_OTHER): Payer: 59 | Admitting: Hematology and Oncology

## 2016-02-29 ENCOUNTER — Inpatient Hospital Stay: Payer: 59 | Attending: Hematology and Oncology

## 2016-02-29 VITALS — BP 104/70 | HR 57 | Temp 96.4°F | Resp 17 | Ht 65.0 in | Wt 131.6 lb

## 2016-02-29 DIAGNOSIS — Z79899 Other long term (current) drug therapy: Secondary | ICD-10-CM | POA: Diagnosis not present

## 2016-02-29 DIAGNOSIS — E538 Deficiency of other specified B group vitamins: Secondary | ICD-10-CM

## 2016-02-29 DIAGNOSIS — R790 Abnormal level of blood mineral: Secondary | ICD-10-CM

## 2016-02-29 LAB — CBC WITH DIFFERENTIAL/PLATELET
Basophils Absolute: 0 10*3/uL (ref 0–0.1)
Basophils Relative: 1 %
Eosinophils Absolute: 0.1 10*3/uL (ref 0–0.7)
Eosinophils Relative: 3 %
HCT: 36.3 % (ref 35.0–47.0)
Hemoglobin: 12.6 g/dL (ref 12.0–16.0)
Lymphocytes Relative: 32 %
Lymphs Abs: 1.4 10*3/uL (ref 1.0–3.6)
MCH: 30.9 pg (ref 26.0–34.0)
MCHC: 34.7 g/dL (ref 32.0–36.0)
MCV: 89 fL (ref 80.0–100.0)
Monocytes Absolute: 0.4 10*3/uL (ref 0.2–0.9)
Monocytes Relative: 8 %
Neutro Abs: 2.5 10*3/uL (ref 1.4–6.5)
Neutrophils Relative %: 56 %
Platelets: 170 10*3/uL (ref 150–440)
RBC: 4.08 MIL/uL (ref 3.80–5.20)
RDW: 13 % (ref 11.5–14.5)
WBC: 4.5 10*3/uL (ref 3.6–11.0)

## 2016-02-29 LAB — VITAMIN B12: Vitamin B-12: 401 pg/mL (ref 180–914)

## 2016-02-29 LAB — FERRITIN: Ferritin: 7 ng/mL — ABNORMAL LOW (ref 11–307)

## 2016-02-29 LAB — FOLATE: Folate: 36 ng/mL (ref >5.9)

## 2016-02-29 MED ORDER — CYANOCOBALAMIN 1000 MCG/ML IJ SOLN: 1000.0000 ug | INTRAMUSCULAR | Status: DC

## 2016-02-29 NOTE — Progress Notes (Signed)
[facility]  Clinic day:  02/29/2016   Chief Complaint: Collene GobbleKaren Tropea is a 45 y.o. female with B12 and folic acid deficiency who is seen for 6 month assessment.  HPI:  The patient was last seen in the hematology clinic on 08/22/2015.  At that time, she felt good. She had intermittent diarrhea. Exam was unremarkable. Folate was 5.4 (low).  She began folate 1 mg a day.  She was to continue monthly B12.  Leukopenia had resolved with supplimentation.  She received B12 on 08/29/2015, 09/27/2015, 10/31/2015, 11/28/2015, 12/28/2015, and 01/25/2016.  She is taking her folic acid.  Symptomatically, she feels pretty good.  She denies any fevers or infections.  She is eating red meat 2-3 times a week.  She is avoiding gluten.  She has had no diarrhea.   Past Medical History  Diagnosis Date  . Anemia   . Vitamin D deficiency   . Gluten intolerance     Past Surgical History  Procedure Laterality Date  . Tubal ligation  06/12/2001    Family History  Problem Relation Age of Onset  . Cancer Mother     thyroid  . Cancer Paternal Grandfather [id number]    Lung  . Cancer Other     Breast (Great Aunt)    Social History:  reports that she has never smoked. She does not have any smokeless tobacco history on file. She reports that she does not drink alcohol or use illicit drugs.  The patient is alone today.  Allergies:  Allergies  Allergen Reactions  . Ciprofloxacin     Current Medications: Current Outpatient Prescriptions  Medication Sig Dispense Refill  . cyanocobalamin (,VITAMIN B-12,) 1000 MCG/ML injection Inject 1,000 mcg into the muscle every 30 (thirty) days.     No current facility-administered medications for this visit.    Review of Systems:  GENERAL:  Feels "pretty good".  No fevers or sweats.  Weight down 4 pounds. PERFORMANCE STATUS (ECOG):  0 HEENT:  No visual changes, runny nose, sore throat, mouth sores or tenderness. Lungs: No shortness  of breath or cough.  No hemoptysis. Cardiac:  No chest pain, palpitations, orthopnea, or PND. GI:  Gluten intolerance.  Diarrhea improved/resolved.  No nausea, vomiting, constipation, melena or hematochezia. GU:  No urgency, frequency, dysuria, or hematuria. Musculoskeletal:  No back pain.  No joint pain.  No muscle tenderness. Extremities:  No pain or swelling. Skin:  No rashes or skin changes. Neuro:  No headache, numbness or weakness, balance or coordination issues. Endocrine:  No diabetes, thyroid issues, hot flashes or night sweats. Psych:  No mood changes, depression or anxiety. Pain:  No focal pain. Review of systems:  All other systems reviewed and found to be negative.  Physical Exam: Blood pressure 104/70, pulse 57, temperature 96.4 F (35.8 C), temperature source Tympanic, resp. rate 17, height 5\' 5"  (1.651 m), weight 131 lb 9.8 oz (59.7 kg). GENERAL:  Thin woman sitting comfortably in the exam room in no acute distress. MENTAL STATUS:  Alert and oriented to person, place and time. HEAD:  Dark short hair.  Normocephalic, atraumatic, face symmetric, no Cushingoid features. EYES:  Glasses.  Brown eyes.  Pupils equal round and reactive to light and accomodation.  No conjunctivitis or scleral icterus. ENT:  Oropharynx clear without lesion.  Tongue normal. Mucous membranes moist.  RESPIRATORY:  Clear to auscultation without rales, wheezes or rhonchi. CARDIOVASCULAR:  Regular rate and rhythm without murmur, rub or gallop. ABDOMEN:  Soft,  non-tender, with active bowel sounds, and no hepatosplenomegaly.  No masses. SKIN:  No rashes, ulcers or lesions. EXTREMITIES: No edema, no skin discoloration or tenderness.  No palpable cords. LYMPH NODES: No palpable cervical, supraclavicular, axillary or inguinal adenopathy  NEUROLOGICAL: Unremarkable. PSYCH:  Appropriate.  Appointment on 02/29/2016  Component Date Value Ref Range Status  . WBC 02/29/2016 4.5  3.6 - 11.0 K/uL Final  . RBC  02/29/2016 4.08  3.80 - 5.20 MIL/uL Final  . Hemoglobin 02/29/2016 12.6  12.0 - 16.0 g/dL Final  . HCT 16/09/9603 36.3  35.0 - 47.0 % Final  . MCV 02/29/2016 89.0  80.0 - 100.0 fL Final  . MCH 02/29/2016 30.9  26.0 - 34.0 pg Final  . MCHC 02/29/2016 34.7  32.0 - 36.0 g/dL Final  . RDW 54/08/8118 13.0  11.5 - 14.5 % Final  . Platelets 02/29/2016 170  150 - 440 K/uL Final  . Neutrophils Relative % 02/29/2016 56   Final  . Neutro Abs 02/29/2016 2.5  1.4 - 6.5 K/uL Final  . Lymphocytes Relative 02/29/2016 32   Final  . Lymphs Abs 02/29/2016 1.4  1.0 - 3.6 K/uL Final  . Monocytes Relative 02/29/2016 8   Final  . Monocytes Absolute 02/29/2016 0.4  0.2 - 0.9 K/uL Final  . Eosinophils Relative 02/29/2016 3   Final  . Eosinophils Absolute 02/29/2016 0.1  0 - 0.7 K/uL Final  . Basophils Relative 02/29/2016 1   Final  . Basophils Absolute 02/29/2016 0.0  0 - 0.1 K/uL Final    Assessment:  Zoiee Wimmer is a 45 y.o. 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  WBC was 2900 in 09/15/2012.  She has had no infections.  WBC has normalized.  She has B12 deficiency and folic acid deficiency.  She has received monthly B12 for 2 years (last 01/25/2016).  She began folic acid on 14/78/2956.  She is eating meat 2-3 times/week.  Symptomatically, she feels good.  Diarrhea has resolved on a gluten free diet.  Exam is unremarkable.  Counts are normal.  Plan: 1.  Labs today:  CBC with diff, ferritin, B12, folate. 2.  Continue B12 monthly.  Patient would like to self administer. 3.  Rx:  B12 1000 mcg IM q 30 days. 4.  Continue folic acid 1 mg a day.   5.  Continue iron rich food. 6.  RTC in 6 months for MD assessment and labs (CBC with diff, ferritin, B12, folate).  Addendum:  Patient contacted regarding low ferritin.  Will start ferrous sulfate 325 mg po BID.  Check labs (CBC and ferritin) in 3 months.   Rosey Bath, MD  02/29/2016,2:28 PM

## 2016-02-29 NOTE — Progress Notes (Signed)
No changes since last visit. 

## 2016-03-01 ENCOUNTER — Telehealth: Payer: Self-pay | Admitting: *Deleted

## 2016-03-01 NOTE — Telephone Encounter (Signed)
-----   Message from Rosey BathMelissa C Corcoran, MD sent at 02/29/2016  4:48 PM EDT ----- Regarding: Low ferritin  Let patient know.  We discussed iron.  M ----- Message -----    From: [facility]    Sent: 02/29/2016   1:54 PM      To: Rosey BathMelissa C Corcoran, MD

## 2016-03-01 NOTE — Telephone Encounter (Signed)
Pt contacted and let her know about ferritin level. I did speak with her about her diet and she had just recently spoke about going back to eat red meats.  I advised her that Dr. Merlene Pullingorcoran would like to to start on ferrous sulfate 325 mg bid. Advised to eat before taking and if she can take it with OJ or vitamin C that helps it absorb better. Also if she gets cramps or constipation it can happen and she can try stool softeners and if she can't tolerate it to please call back and let office know. We could set up IV iron if needed. She was agreeable to this plan.

## 2016-03-07 ENCOUNTER — Inpatient Hospital Stay: Payer: 59

## 2016-03-24 ENCOUNTER — Encounter: Payer: Self-pay | Admitting: Hematology and Oncology

## 2016-03-24 ENCOUNTER — Other Ambulatory Visit: Payer: Self-pay | Admitting: Hematology and Oncology

## 2016-03-24 DIAGNOSIS — E538 Deficiency of other specified B group vitamins: Secondary | ICD-10-CM

## 2016-03-24 DIAGNOSIS — R790 Abnormal level of blood mineral: Secondary | ICD-10-CM

## 2016-05-28 ENCOUNTER — Inpatient Hospital Stay: Payer: 59 | Attending: Hematology and Oncology

## 2016-05-28 DIAGNOSIS — R790 Abnormal level of blood mineral: Secondary | ICD-10-CM

## 2016-05-28 DIAGNOSIS — Z79899 Other long term (current) drug therapy: Secondary | ICD-10-CM | POA: Diagnosis not present

## 2016-05-28 DIAGNOSIS — E538 Deficiency of other specified B group vitamins: Secondary | ICD-10-CM | POA: Insufficient documentation

## 2016-05-28 LAB — CBC WITH DIFFERENTIAL/PLATELET
Basophils Absolute: 0 10*3/uL (ref 0–0.1)
Basophils Relative: 1 %
Eosinophils Absolute: 0.2 10*3/uL (ref 0–0.7)
Eosinophils Relative: 4 %
HCT: 37.2 % (ref 35.0–47.0)
Hemoglobin: 12.9 g/dL (ref 12.0–16.0)
Lymphocytes Relative: 37 %
Lymphs Abs: 1.5 10*3/uL (ref 1.0–3.6)
MCH: 31.1 pg (ref 26.0–34.0)
MCHC: 34.7 g/dL (ref 32.0–36.0)
MCV: 89.5 fL (ref 80.0–100.0)
Monocytes Absolute: 0.4 10*3/uL (ref 0.2–0.9)
Monocytes Relative: 9 %
Neutro Abs: 2 10*3/uL (ref 1.4–6.5)
Neutrophils Relative %: 49 %
Platelets: 163 10*3/uL (ref 150–440)
RBC: 4.16 MIL/uL (ref 3.80–5.20)
RDW: 12.5 % (ref 11.5–14.5)
WBC: 4.1 10*3/uL (ref 3.6–11.0)

## 2016-05-28 LAB — FERRITIN: Ferritin: 15 ng/mL (ref 11–307)

## 2016-05-29 ENCOUNTER — Telehealth: Payer: Self-pay

## 2016-05-29 NOTE — Telephone Encounter (Signed)
Called to Notify patient that her ferritin is inching up (7 to 15).  Per MD.  Left VM on pt's personal VM to call me back if any she had any questions

## 2016-05-31 ENCOUNTER — Other Ambulatory Visit: Payer: 59

## 2016-06-26 ENCOUNTER — Encounter: Payer: Self-pay | Admitting: Family Medicine

## 2016-06-26 ENCOUNTER — Ambulatory Visit (INDEPENDENT_AMBULATORY_CARE_PROVIDER_SITE_OTHER): Payer: 59 | Admitting: Family Medicine

## 2016-06-26 VITALS — BP 120/74 | HR 78 | Temp 98.2°F | Wt 127.0 lb

## 2016-06-26 DIAGNOSIS — R197 Diarrhea, unspecified: Secondary | ICD-10-CM | POA: Diagnosis not present

## 2016-06-26 DIAGNOSIS — R634 Abnormal weight loss: Secondary | ICD-10-CM | POA: Diagnosis not present

## 2016-06-26 NOTE — Progress Notes (Signed)
Subjective:   Patient ID: Shari Vasquez, female    DOB: 07/15/1971, 45 y.o.   MRN: [id number]  Shari Vasquez is a pleasant 45 y.o. year old female who presents to clinic today with Abdominal Pain and Diarrhea  on 06/26/2016  HPI:  IBS like symptoms for years- intermittent diarrhea, lower abdominal pain that comes and goes.  Follows a strict gluten free diet but this has not changed.   Over past few weeks, diarrhea has persisted.  Immodium improves her symptoms but diarrhea returns as soon as she stops taking it.  Now noticing something fleshy (? Mucous) in her stool and having some right lower abdominal carmping intermittently.  No blood in her stool.  She has lost weight unintentionally throughout this episode.  No known family history of colon issues/IBD.  Wt Readings from Last 3 Encounters:  06/26/16 127 lb (57.6 kg)  02/29/16 131 lb 9.8 oz (59.7 kg)  08/22/15 135 lb 9.3 oz (61.5 kg)   Current Outpatient Prescriptions on File Prior to Visit  Medication Sig Dispense Refill  . cyanocobalamin (,VITAMIN B-12,) 1000 MCG/ML injection Inject 1 mL (1,000 mcg total) into the muscle every 30 (thirty) days. 1 mL 6   No current facility-administered medications on file prior to visit.     Allergies  Allergen Reactions  . Ciprofloxacin     Past Medical History:  Diagnosis Date  . Anemia   . Gluten intolerance   . Vitamin D deficiency     Past Surgical History:  Procedure Laterality Date  . TUBAL LIGATION  06/12/2001    Family History  Problem Relation Age of Onset  . Cancer Mother     thyroid  . Cancer Paternal Grandfather 49    Lung  . Cancer Other     Breast (Great Aunt)    Social History   Social History  . Marital status: Married    Spouse name: N/A  . Number of children: 2  . Years of education: N/A   Occupational History  . Chiropractor    Social History Main Topics  . Smoking status: Never Smoker  . Smokeless tobacco: Not on file  . Alcohol use  No  . Drug use: No  . Sexual activity: Yes   Other Topics Concern  . Not on file   Social History Narrative   Religion affecting care-no immunizations   Regular exercise   The PMH, PSH, Social History, Family History, Medications, and allergies have been reviewed in [facility], and have been updated if relevant.   Review of Systems  Constitutional: Positive for unexpected weight change. Negative for chills, diaphoresis, fatigue and fever.  Gastrointestinal: Positive for abdominal pain and diarrhea. Negative for anal bleeding, blood in stool, constipation, nausea, rectal pain and vomiting.  Genitourinary: Negative.   Musculoskeletal: Negative.   All other systems reviewed and are negative.      Objective:    BP 120/74   Pulse 78   Temp 98.2 F (36.8 C) (Oral)   Wt 127 lb (57.6 kg)   LMP 06/05/2016 (Approximate)   SpO2 98%   BMI 21.13 kg/m   Wt Readings from Last 3 Encounters:  06/26/16 127 lb (57.6 kg)  02/29/16 131 lb 9.8 oz (59.7 kg)  08/22/15 135 lb 9.3 oz (61.5 kg)     Physical Exam  Constitutional: She is oriented to person, place, and time. She appears well-developed and well-nourished. No distress.  HENT:  Head: Normocephalic.  Eyes: Conjunctivae are normal.  Cardiovascular: Normal rate.  Pulmonary/Chest: Effort normal.  Abdominal: Soft. Bowel sounds are normal. She exhibits no mass. There is no tenderness. There is no rebound and no guarding.  Musculoskeletal: Normal range of motion.  Neurological: She is alert and oriented to person, place, and time. No cranial nerve deficit.  Skin: Skin is warm and dry. She is not diaphoretic.  Psychiatric: She has a normal mood and affect. Her behavior is normal. Judgment and thought content normal.  Nursing note and vitals reviewed.         Assessment & Plan:   Diarrhea, unspecified type No Follow-up on file.

## 2016-06-26 NOTE — Progress Notes (Signed)
Pre visit review using our clinic review tool, if applicable. No additional management support is needed unless otherwise documented below in the visit note. 

## 2016-06-26 NOTE — Patient Instructions (Signed)
Please restart a probiotic.  Stop by to see Shari Vasquez on your way out.  VSL #3

## 2016-06-26 NOTE — Assessment & Plan Note (Signed)
Persistent with possible mucous in her stool and weight loss. ?IBD Already gluten free. Refer to GI for work up/colonoscopy. Did advise that she start taking a probiotic. The patient indicates understanding of these issues and agrees with the plan.

## 2016-08-02 ENCOUNTER — Other Ambulatory Visit
Admission: RE | Admit: 2016-08-02 | Discharge: 2016-08-02 | Disposition: A | Discharge status: Home / Self Care | Payer: 59 | Source: Ambulatory Visit | Attending: Gastroenterology | Admitting: Gastroenterology

## 2016-08-06 ENCOUNTER — Other Ambulatory Visit
Admission: RE | Admit: 2016-08-06 | Discharge: 2016-08-06 | Disposition: A | Discharge status: Home / Self Care | Payer: 59 | Source: Ambulatory Visit | Attending: Gastroenterology | Admitting: Gastroenterology

## 2016-08-06 DIAGNOSIS — R1032 Left lower quadrant pain: Secondary | ICD-10-CM | POA: Insufficient documentation

## 2016-08-06 DIAGNOSIS — R197 Diarrhea, unspecified: Secondary | ICD-10-CM | POA: Insufficient documentation

## 2016-08-06 DIAGNOSIS — R1031 Right lower quadrant pain: Secondary | ICD-10-CM | POA: Insufficient documentation

## 2016-08-09 ENCOUNTER — Other Ambulatory Visit
Admission: RE | Admit: 2016-08-09 | Discharge: 2016-08-09 | Disposition: A | Discharge status: Home / Self Care | Payer: 59 | Source: Ambulatory Visit | Attending: Gastroenterology | Admitting: Gastroenterology

## 2016-08-09 DIAGNOSIS — R197 Diarrhea, unspecified: Secondary | ICD-10-CM | POA: Insufficient documentation

## 2016-08-09 LAB — GASTROINTESTINAL PANEL BY PCR, STOOL (REPLACES STOOL CULTURE)

## 2016-08-09 LAB — C DIFFICILE QUICK SCREEN W PCR REFLEX
C Diff antigen: NEGATIVE
C Diff interpretation: NOT DETECTED
C Diff toxin: NEGATIVE

## 2016-08-14 LAB — MISCELLANEOUS TEST

## 2016-08-29 ENCOUNTER — Inpatient Hospital Stay: Payer: 59 | Attending: Oncology | Admitting: Hematology and Oncology

## 2016-08-29 ENCOUNTER — Ambulatory Visit: Payer: 59 | Admitting: Oncology

## 2016-08-29 ENCOUNTER — Inpatient Hospital Stay: Payer: 59

## 2016-08-29 ENCOUNTER — Other Ambulatory Visit: Payer: 59

## 2016-08-29 ENCOUNTER — Encounter: Payer: Self-pay | Admitting: Hematology and Oncology

## 2016-08-29 ENCOUNTER — Other Ambulatory Visit: Payer: Self-pay | Admitting: *Deleted

## 2016-08-29 VITALS — BP 106/71 | HR 71 | Temp 97.2°F | Resp 18 | Wt 124.4 lb

## 2016-08-29 DIAGNOSIS — E559 Vitamin D deficiency, unspecified: Secondary | ICD-10-CM | POA: Insufficient documentation

## 2016-08-29 DIAGNOSIS — D519 Vitamin B12 deficiency anemia, unspecified: Secondary | ICD-10-CM

## 2016-08-29 DIAGNOSIS — E538 Deficiency of other specified B group vitamins: Secondary | ICD-10-CM

## 2016-08-29 DIAGNOSIS — Z79899 Other long term (current) drug therapy: Secondary | ICD-10-CM

## 2016-08-29 DIAGNOSIS — D538 Other specified nutritional anemias: Secondary | ICD-10-CM | POA: Diagnosis not present

## 2016-08-29 DIAGNOSIS — D72819 Decreased white blood cell count, unspecified: Secondary | ICD-10-CM | POA: Insufficient documentation

## 2016-08-29 DIAGNOSIS — D559 Anemia due to enzyme disorder, unspecified: Secondary | ICD-10-CM | POA: Diagnosis not present

## 2016-08-29 DIAGNOSIS — R197 Diarrhea, unspecified: Secondary | ICD-10-CM | POA: Diagnosis not present

## 2016-08-29 DIAGNOSIS — D509 Iron deficiency anemia, unspecified: Secondary | ICD-10-CM

## 2016-08-29 LAB — CBC WITH DIFFERENTIAL/PLATELET
Basophils Absolute: 0 10*3/uL (ref 0–0.1)
Basophils Relative: 1 %
Eosinophils Absolute: 0.1 10*3/uL (ref 0–0.7)
Eosinophils Relative: 3 %
HCT: 34.6 % — ABNORMAL LOW (ref 35.0–47.0)
Hemoglobin: 12.1 g/dL (ref 12.0–16.0)
Lymphocytes Relative: 37 %
Lymphs Abs: 1.4 10*3/uL (ref 1.0–3.6)
MCH: 31.3 pg (ref 26.0–34.0)
MCHC: 35 g/dL (ref 32.0–36.0)
MCV: 89.4 fL (ref 80.0–100.0)
Monocytes Absolute: 0.3 10*3/uL (ref 0.2–0.9)
Monocytes Relative: 9 %
Neutro Abs: 1.9 10*3/uL (ref 1.4–6.5)
Neutrophils Relative %: 50 %
Platelets: 193 10*3/uL (ref 150–440)
RBC: 3.87 MIL/uL (ref 3.80–5.20)
RDW: 12.6 % (ref 11.5–14.5)
WBC: 3.7 10*3/uL (ref 3.6–11.0)

## 2016-08-29 NOTE — Progress Notes (Signed)
Patient is here for follow up she is doing ok has no appetite, says when she eats she has diarrhea.

## 2016-08-29 NOTE — Patient Instructions (Signed)
Iron Sucrose injection  What is this medicine?  IRON SUCROSE (AHY ern SOO krohs) is an iron complex. Iron is used to make healthy red blood cells, which carry oxygen and nutrients throughout the body. This medicine is used to treat iron deficiency anemia in people with chronic kidney disease.  This medicine may be used for other purposes; ask your health care provider or pharmacist if you have questions.  What should I tell my health care provider before I take this medicine?  They need to know if you have any of these conditions:  -anemia not caused by low iron levels  -heart disease  -high levels of iron in the blood  -kidney disease  -liver disease  -an unusual or allergic reaction to iron, other medicines, foods, dyes, or preservatives  -pregnant or trying to get pregnant  -breast-feeding  How should I use this medicine?  This medicine is for infusion into a vein. It is given by a health care professional in a hospital or clinic setting.  Talk to your pediatrician regarding the use of this medicine in children. While this drug may be prescribed for children as young as 2 years for selected conditions, precautions do apply.  Overdosage: If you think you have taken too much of this medicine contact a poison control center or emergency room at once.  NOTE: This medicine is only for you. Do not share this medicine with others.  What if I miss a dose?  It is important not to miss your dose. Call your doctor or health care professional if you are unable to keep an appointment.  What may interact with this medicine?  Do not take this medicine with any of the following medications:  -deferoxamine  -dimercaprol  -other iron products  This medicine may also interact with the following medications:  -chloramphenicol  -deferasirox  This list may not describe all possible interactions. Give your health care provider a list of all the medicines, herbs, non-prescription drugs, or dietary supplements you use. Also tell them if  you smoke, drink alcohol, or use illegal drugs. Some items may interact with your medicine.  What should I watch for while using this medicine?  Visit your doctor or healthcare professional regularly. Tell your doctor or healthcare professional if your symptoms do not start to get better or if they get worse. You may need blood work done while you are taking this medicine.  You may need to follow a special diet. Talk to your doctor. Foods that contain iron include: whole grains/cereals, dried fruits, beans, or peas, leafy green vegetables, and organ meats (liver, kidney).  What side effects may I notice from receiving this medicine?  Side effects that you should report to your doctor or health care professional as soon as possible:  -allergic reactions like skin rash, itching or hives, swelling of the face, lips, or tongue  -breathing problems  -changes in blood pressure  -cough  -fast, irregular heartbeat  -feeling faint or lightheaded, falls  -fever or chills  -flushing, sweating, or hot feelings  -joint or muscle aches/pains  -seizures  -swelling of the ankles or feet  -unusually weak or tired  Side effects that usually do not require medical attention (report to your doctor or health care professional if they continue or are bothersome):  -diarrhea  -feeling achy  -headache  -irritation at site where injected  -nausea, vomiting  -stomach upset  -tiredness  This list may not describe all possible side effects. Call your doctor   for medical advice about side effects. You may report side effects to FDA at 1-800-FDA-1088.  Where should I keep my medicine?  This drug is given in a hospital or clinic and will not be stored at home.  NOTE: This sheet is a summary. It may not cover all possible information. If you have questions about this medicine, talk to your doctor, pharmacist, or health care provider.      2016, Elsevier/Gold Standard. (2011-08-29 17:14:35)

## 2016-08-29 NOTE — Progress Notes (Addendum)
[facility]-  Cancer Center  Clinic day:  08/29/16   Chief Complaint: Shari Vasquez is a 45 y.o. female with B12 and folic acid deficiency who is seen for 6 month assessment.  HPI:  The patient was last seen in the hematology clinic on 02/29/2016.  At that time, she felt pretty good. She was eating Vasquez meat 2-3 times a week.  She was avoiding gluten.  She had no diarrhea.  Hematocrit was 36.1, hemoglobin 12.6 and MCV 89.  B12 was 401. Folate was 36.  Ferritin was 7.    At last visit, she was given a prescription for B12 injections at home.  She was to continue her oral folic acid. She was encouraged to eat iron rich foods.  After her appointment, ferritin became available. She was instructed to begin ferrous sulfate 325 mg twice a day.  CBC on 05/28/2016 included a hematocrit 37.2, hemoglobin 12.9, and MCV 89.5.  Ferritin was 15.  Symptomatically, her "gut has gotten bad". She notes "really bad diarrhea". She's not had any oral iron since 06/2016. She is eating Vasquez meat 3 x a week.   She was seen by Vevelyn Pathristiane London on 08/01/2016.  She is scheduled for colonoscopy with Dr. Marva PandaSkulskie on 11/12/2016.  Past Medical History:  Diagnosis Date  . Anemia   . Gluten intolerance   . Vitamin D deficiency     Past Surgical History:  Procedure Laterality Date  . TUBAL LIGATION  06/12/2001    Family History  Problem Relation Age of Onset  . Cancer Mother     thyroid  . Cancer Paternal Grandfather 565    Lung  . Cancer Other     Breast (Great Aunt)    Social History:  reports that she has never smoked. She does not have any smokeless tobacco history on file. She reports that she does not drink alcohol or use drugs.  The patient is alone today.  Allergies:  Allergies  Allergen Reactions  . Ciprofloxacin     Current Medications: Current Outpatient Prescriptions  Medication Sig Dispense Refill  . cyanocobalamin (,VITAMIN B-12,) 1000 MCG/ML injection Inject 1 mL  (1,000 mcg total) into the muscle every 30 (thirty) days. 1 mL 6  . ferrous sulfate 325 (65 FE) MG tablet Take by mouth.    . folic acid (FOLVITE) 400 MCG tablet Take by mouth.     No current facility-administered medications for this visit.     Review of Systems:  GENERAL:  Feels "ok".  No fevers or sweats.  Weight down 7 pounds. PERFORMANCE STATUS (ECOG):  0 HEENT:  No visual changes, runny nose, sore throat, mouth sores or tenderness. Lungs: No shortness of breath or cough.  No hemoptysis. Cardiac:  No chest pain, palpitations, orthopnea, or PND. GI:  Ongoing GI issues (see HPI).  No appetite.  No nausea, vomiting, constipation, melena or hematochezia. GU:  No urgency, frequency, dysuria, or hematuria. Musculoskeletal:  No back pain.  No joint pain.  No muscle tenderness. Extremities:  No pain or swelling. Skin:  No rashes or skin changes. Neuro:  No headache, numbness or weakness, balance or coordination issues. Endocrine:  No diabetes, thyroid issues, hot flashes or night sweats. Psych:  No mood changes, depression or anxiety. Pain:  No focal pain. Review of systems:  All other systems reviewed and found to be negative.  Physical Exam: Blood pressure 106/71, pulse 71, temperature 97.2 F (36.2 C), temperature source Tympanic, resp. rate 18, weight 124 lb  7.2 oz (56.4 kg). GENERAL:  Thin woman sitting comfortably in the exam room in no acute distress. MENTAL STATUS:  Alert and oriented to person, place and time. HEAD:  Short black spiked hair.  Normocephalic, atraumatic, face symmetric, no Cushingoid features. EYES:  Glasses.  Brown eyes.  Pupils equal round and reactive to light and accomodation.  No conjunctivitis or scleral icterus. ENT:  Oropharynx clear without lesion.  Tongue normal. Mucous membranes moist.  RESPIRATORY:  Clear to auscultation without rales, wheezes or rhonchi. CARDIOVASCULAR:  Regular rate and rhythm without murmur, rub or gallop. ABDOMEN:  Soft,  non-tender, with active bowel sounds, and no hepatosplenomegaly.  No masses. SKIN:  No rashes, ulcers or lesions. EXTREMITIES: No edema, no skin discoloration or tenderness.  No palpable cords. LYMPH NODES: No palpable cervical, supraclavicular, axillary or inguinal adenopathy  NEUROLOGICAL: Unremarkable. PSYCH:  Appropriate.   Appointment on 08/29/2016  Component Date Value Ref Range Status  . WBC 08/29/2016 3.7  3.6 - 11.0 K/uL Final  . RBC 08/29/2016 3.87  3.80 - 5.20 MIL/uL Final  . Hemoglobin 08/29/2016 12.1  12.0 - 16.0 g/dL Final  . HCT 45/40/9811 34.6* 35.0 - 47.0 % Final  . MCV 08/29/2016 89.4  80.0 - 100.0 fL Final  . MCH 08/29/2016 31.3  26.0 - 34.0 pg Final  . MCHC 08/29/2016 35.0  32.0 - 36.0 g/dL Final  . RDW 91/47/8295 12.6  11.5 - 14.5 % Final  . Platelets 08/29/2016 193  150 - 440 K/uL Final  . Neutrophils Relative % 08/29/2016 50  % Final  . Neutro Abs 08/29/2016 1.9  1.4 - 6.5 K/uL Final  . Lymphocytes Relative 08/29/2016 37  % Final  . Lymphs Abs 08/29/2016 1.4  1.0 - 3.6 K/uL Final  . Monocytes Relative 08/29/2016 9  % Final  . Monocytes Absolute 08/29/2016 0.3  0.2 - 0.9 K/uL Final  . Eosinophils Relative 08/29/2016 3  % Final  . Eosinophils Absolute 08/29/2016 0.1  0 - 0.7 K/uL Final  . Basophils Relative 08/29/2016 1  % Final  . Basophils Absolute 08/29/2016 0.0  0 - 0.1 K/uL Final    Assessment:  Shari Vasquez is a 45 y.o. 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  WBC was 2900 in 09/15/2012.  She has had no infections.  WBC has normalized.  She has B12 deficiency and folic acid deficiency.  She has received monthly B12 for 2 years (last 01/25/2016) then switched to self administration.  She began folic acid on 62/13/0865.  She is eating meat 3 times/week.  Symptomatically, she feels has had worsening diarrhea since 06/2016.  She has been unable to take oral iron.  She has seen GI and colonoscopy is  planned.  She has lost 7 pounds in 3 months.  Plan: 1.  Labs today:  CBC with diff, ferritin, B12, folate. 2.  Continue B12 monthly.   3.  Continue folic acid 1 mg a day.   4.  Continue iron rich food. 5.  Nurse to call patient with ferritin.  She is considering Venofer 6.  Preauth Venofer 7.  Follow-up colonoscopy on 11/12/2016. 8.  RTC in 3 months for labs (CBC with diff, ferritin) 9.  RTC in 6 months for MD assess, labs (CBC with diff, ferritin, B12, folate).   Rosey Bath, MD  08/29/2016,2:39 PM

## 2016-08-31 ENCOUNTER — Encounter: Payer: Self-pay | Admitting: Hematology and Oncology

## 2016-08-31 DIAGNOSIS — D509 Iron deficiency anemia, unspecified: Secondary | ICD-10-CM | POA: Insufficient documentation

## 2016-09-02 ENCOUNTER — Telehealth: Payer: Self-pay | Admitting: *Deleted

## 2016-09-02 NOTE — Telephone Encounter (Signed)
Called patient and LVM that HCT is decreased.  MD recommends IV iron (Venofer).  Before it can be scheduled patient will need additional labs (Ferritin, Folic Acid and B-12). Advised patient to call us back to let us know when she can come in for the labs and the infusion.

## 2016-09-03 ENCOUNTER — Other Ambulatory Visit: Payer: Self-pay | Admitting: *Deleted

## 2016-09-03 ENCOUNTER — Inpatient Hospital Stay: Payer: 59 | Attending: Oncology

## 2016-09-03 DIAGNOSIS — E538 Deficiency of other specified B group vitamins: Secondary | ICD-10-CM | POA: Diagnosis not present

## 2016-09-03 DIAGNOSIS — Z79899 Other long term (current) drug therapy: Secondary | ICD-10-CM | POA: Diagnosis not present

## 2016-09-03 DIAGNOSIS — R197 Diarrhea, unspecified: Secondary | ICD-10-CM | POA: Insufficient documentation

## 2016-09-03 DIAGNOSIS — D72819 Decreased white blood cell count, unspecified: Secondary | ICD-10-CM | POA: Insufficient documentation

## 2016-09-03 DIAGNOSIS — E559 Vitamin D deficiency, unspecified: Secondary | ICD-10-CM | POA: Diagnosis not present

## 2016-09-03 DIAGNOSIS — D509 Iron deficiency anemia, unspecified: Secondary | ICD-10-CM

## 2016-09-03 LAB — IRON AND TIBC
Iron: 103 ug/dL (ref 28–170)
Saturation Ratios: 35 % — ABNORMAL HIGH (ref 10.4–31.8)
TIBC: 297 ug/dL (ref 250–450)
UIBC: 194 ug/dL

## 2016-09-03 LAB — VITAMIN B12: Vitamin B-12: 708 pg/mL (ref 180–914)

## 2016-09-03 LAB — FERRITIN: Ferritin: 15 ng/mL (ref 11–307)

## 2016-09-03 LAB — FOLATE: Folate: 26 ng/mL (ref >5.9)

## 2016-09-03 NOTE — Telephone Encounter (Signed)
Pt called back and she is fine to come over today and get labs to determine if she needs venofer.  She is thinking about getting venofer. She will come to cancer center and have labs 2 pm today

## 2016-09-04 ENCOUNTER — Telehealth: Payer: Self-pay | Admitting: *Deleted

## 2016-09-04 NOTE — Telephone Encounter (Signed)
-----   Message from Rosey BathMelissa C Corcoran, MD sent at 09/03/2016  4:57 PM EDT ----- Regarding: Please call patient  Ferritin 15.  Hematocrit drifting down, but ok.  We can set up Venofer if she would like.  M ----- Message ----- From: Interface, [facility] [place] Sent: 09/03/2016   4:37 PM To: Rosey BathMelissa C Corcoran, MD

## 2016-09-04 NOTE — Telephone Encounter (Signed)
Called and spoke to patient and decided that she woulld take the venofer and her ferritin came down to 15.  She wants Friday am appt and she does not care if she starts this week or next.  I started her next week due to we can not add any other treatments on for this week.  Made appt 10/13 and 10/20 at 9 am with each visit. Pt agreeable to the plan and the appt

## 2016-09-13 ENCOUNTER — Other Ambulatory Visit: Payer: Self-pay | Admitting: Hematology and Oncology

## 2016-09-13 ENCOUNTER — Inpatient Hospital Stay: Payer: 59

## 2016-09-13 VITALS — BP 107/67 | HR 60 | Temp 97.8°F | Resp 18

## 2016-09-13 DIAGNOSIS — D509 Iron deficiency anemia, unspecified: Secondary | ICD-10-CM

## 2016-09-13 DIAGNOSIS — D72819 Decreased white blood cell count, unspecified: Secondary | ICD-10-CM | POA: Diagnosis not present

## 2016-09-13 MED ORDER — SODIUM CHLORIDE 0.9 % IV SOLN: 200.0000 mg | Freq: Once | INTRAVENOUS | Status: DC

## 2016-09-13 MED ORDER — SODIUM CHLORIDE 0.9 % IV SOLN
Freq: Once | INTRAVENOUS | Status: AC
  Administered 2016-09-13: 14:00:00 via INTRAVENOUS
  Filled 2016-09-13: qty 1000

## 2016-09-13 MED ORDER — IRON SUCROSE 20 MG/ML IV SOLN
200.0000 mg | Freq: Once | INTRAVENOUS | Status: AC
  Administered 2016-09-13: 200 mg via INTRAVENOUS
  Filled 2016-09-13: qty 10

## 2016-09-20 ENCOUNTER — Other Ambulatory Visit: Payer: Self-pay | Admitting: Hematology and Oncology

## 2016-09-20 ENCOUNTER — Inpatient Hospital Stay: Payer: 59

## 2016-09-20 VITALS — BP 100/62 | HR 69 | Temp 96.4°F | Resp 18

## 2016-09-20 DIAGNOSIS — D519 Vitamin B12 deficiency anemia, unspecified: Secondary | ICD-10-CM

## 2016-09-20 DIAGNOSIS — D72819 Decreased white blood cell count, unspecified: Secondary | ICD-10-CM | POA: Diagnosis not present

## 2016-09-20 MED ORDER — IRON SUCROSE 20 MG/ML IV SOLN
200.0000 mg | Freq: Once | INTRAVENOUS | Status: AC
  Administered 2016-09-20: 200 mg via INTRAVENOUS
  Filled 2016-09-20: qty 10

## 2016-09-20 MED ORDER — SODIUM CHLORIDE 0.9 % IV SOLN
Freq: Once | INTRAVENOUS | Status: AC
  Administered 2016-09-20: 09:00:00 via INTRAVENOUS
  Filled 2016-09-20: qty 1000

## 2016-11-11 ENCOUNTER — Encounter: Payer: Self-pay | Admitting: *Deleted

## 2016-11-12 ENCOUNTER — Encounter
Admission: RE | Disposition: A | Discharge status: Home / Self Care | Payer: Self-pay | Source: Ambulatory Visit | Attending: Gastroenterology

## 2016-11-12 ENCOUNTER — Ambulatory Visit: Payer: 59 | Admitting: Certified Registered Nurse Anesthetist

## 2016-11-12 ENCOUNTER — Encounter: Payer: Self-pay | Admitting: Family Medicine

## 2016-11-12 ENCOUNTER — Ambulatory Visit
Admission: RE | Admit: 2016-11-12 | Discharge: 2016-11-12 | Disposition: A | Discharge status: Home / Self Care | Payer: 59 | Source: Ambulatory Visit | Attending: Gastroenterology | Admitting: Gastroenterology

## 2016-11-12 DIAGNOSIS — R1030 Lower abdominal pain, unspecified: Secondary | ICD-10-CM | POA: Insufficient documentation

## 2016-11-12 DIAGNOSIS — K529 Noninfective gastroenteritis and colitis, unspecified: Secondary | ICD-10-CM | POA: Insufficient documentation

## 2016-11-12 DIAGNOSIS — K5730 Diverticulosis of large intestine without perforation or abscess without bleeding: Secondary | ICD-10-CM | POA: Insufficient documentation

## 2016-11-12 DIAGNOSIS — K2950 Unspecified chronic gastritis without bleeding: Secondary | ICD-10-CM | POA: Diagnosis not present

## 2016-11-12 DIAGNOSIS — E538 Deficiency of other specified B group vitamins: Secondary | ICD-10-CM | POA: Insufficient documentation

## 2016-11-12 DIAGNOSIS — R197 Diarrhea, unspecified: Secondary | ICD-10-CM | POA: Diagnosis present

## 2016-11-12 DIAGNOSIS — D509 Iron deficiency anemia, unspecified: Secondary | ICD-10-CM | POA: Insufficient documentation

## 2016-11-12 HISTORY — PX: ESOPHAGOGASTRODUODENOSCOPY (EGD) WITH PROPOFOL: SHX5813

## 2016-11-12 HISTORY — PX: COLONOSCOPY WITH PROPOFOL: SHX5780

## 2016-11-12 LAB — POCT PREGNANCY, URINE: Preg Test, Ur: NEGATIVE

## 2016-11-12 SURGERY — COLONOSCOPY WITH PROPOFOL
Anesthesia: General

## 2016-11-12 MED ORDER — PROPOFOL 10 MG/ML IV BOLUS
INTRAVENOUS | Status: DC | PRN
Start: 2016-11-12 — End: 2016-11-12
  Administered 2016-11-12: 20 mg via INTRAVENOUS
  Administered 2016-11-12: 10 mg via INTRAVENOUS
  Administered 2016-11-12: 20 mg via INTRAVENOUS
  Administered 2016-11-12: 50 mg via INTRAVENOUS

## 2016-11-12 MED ORDER — SODIUM CHLORIDE 0.9 % IV SOLN
INTRAVENOUS | Status: DC
  Administered 2016-11-12: 13:00:00 via INTRAVENOUS

## 2016-11-12 MED ORDER — EPHEDRINE SULFATE 50 MG/ML IJ SOLN
INTRAMUSCULAR | Status: DC | PRN
  Administered 2016-11-12 (×4): 5 mg via INTRAVENOUS

## 2016-11-12 MED ORDER — LIDOCAINE HCL (CARDIAC) 20 MG/ML IV SOLN
INTRAVENOUS | Status: DC | PRN
  Administered 2016-11-12: 100 mg via INTRAVENOUS

## 2016-11-12 MED ORDER — FENTANYL CITRATE (PF) 100 MCG/2ML IJ SOLN
INTRAMUSCULAR | Status: DC | PRN
  Administered 2016-11-12 (×2): 50 ug via INTRAVENOUS

## 2016-11-12 MED ORDER — MIDAZOLAM HCL 2 MG/2ML IJ SOLN
INTRAMUSCULAR | Status: DC | PRN
  Administered 2016-11-12: 2 mg via INTRAVENOUS

## 2016-11-12 MED ORDER — PROPOFOL 500 MG/50ML IV EMUL
INTRAVENOUS | Status: DC | PRN
  Administered 2016-11-12: 140 ug/kg/min via INTRAVENOUS

## 2016-11-12 NOTE — Op Note (Signed)
[facility] Gastroenterology Patient Name: Shari GobbleKaren Vasquez Procedure Date: 11/12/2016 1:13 PM MRN: [id number] Account #: [id number] Date of Birth: 06/30/1971 Admit Type: Outpatient Age: [age] [facility]: [facility] ROOM 3 Gender: Female Note Status: Finalized Procedure:            Colonoscopy Indications:          Lower abdominal pain, Chronic diarrhea Providers:            Christena DeemMartin U. Traeger Sultana, MD Referring MD:         No Local Md, MD (Referring MD) Medicines:            Monitored Anesthesia Care Complications:        No immediate complications. Procedure:            Pre-Anesthesia Assessment:                       - ASA Grade Assessment: II - A patient with mild                        systemic disease.                       After obtaining informed consent, the colonoscope was                        passed under direct vision. Throughout the procedure,                        the patient's blood pressure, pulse, and oxygen                        saturations were monitored continuously. The                        Colonoscope was introduced through the anus and                        advanced to the the cecum, identified by appendiceal                        orifice and ileocecal valve. The colonoscopy was                        performed with moderate difficulty due to a tortuous                        colon. The patient tolerated the procedure well. The                        quality of the bowel preparation was good. Findings:      A few small-mouthed diverticula were found in the sigmoid colon and       distal descending colon.      Biopsies for histology were taken with a cold forceps from the right       colon and left colon for evaluation of microscopic colitis.      The digital rectal exam was normal.      The entire examined colon appeared normal. Impression:           - Diverticulosis in the sigmoid colon and in the distal  descending colon.                       - The entire examined colon is normal.                       - Biopsies were taken with a cold forceps from the                        right colon and left colon for evaluation of                        microscopic colitis. Recommendation:       - Discharge patient to home.                       - Await pathology results.                       - Return to GI clinic in 3 weeks. Procedure Code(s):    --- Professional ---                       [id number], Colonoscopy, flexible; with biopsy, single or                        multiple Diagnosis Code(s):    --- Professional ---                       R10.30, Lower abdominal pain, unspecified                       K52.9, Noninfective gastroenteritis and colitis,                        unspecified                       K57.30, Diverticulosis of large intestine without                        perforation or abscess without bleeding CPT copyright 2016 American Medical Association. All rights reserved. The codes documented in this report are preliminary and upon coder review may  be revised to meet current compliance requirements. Christena DeemMartin U Karyn Brull, MD 11/12/2016 2:02:19 PM This report has been signed electronically. Number of Addenda: 0 Note Initiated On: 11/12/2016 1:13 PM Scope Withdrawal Time: 0 hours 7 minutes 10 seconds  Total Procedure Duration: 0 hours 17 minutes 23 seconds       [facility]

## 2016-11-12 NOTE — Transfer of Care (Signed)
Immediate Anesthesia Transfer of Care Note  Patient: Solmon IceKaren L Cremer  Procedure(s) Performed: Procedure(s): COLONOSCOPY WITH PROPOFOL (N/A) ESOPHAGOGASTRODUODENOSCOPY (EGD) WITH PROPOFOL (N/A)  Patient Location: PACU  Anesthesia Type:General  Level of Consciousness: awake, alert  and oriented  Airway & Oxygen Therapy: Patient Spontanous Breathing and Patient connected to nasal cannula oxygen  Post-op Assessment: Report given to RN and Post -op Vital signs reviewed and stable  Post vital signs: Reviewed and stable  Last Vitals:  Vitals:   11/12/16 1250 11/12/16 1252  BP:  128/87  Pulse: 77   Resp: 16   Temp: 36.8 C     Last Pain:  Vitals:   11/12/16 1250  TempSrc: Tympanic      Patients Stated Pain Goal: 0 (11/12/16 1250)  Complications: No apparent anesthesia complications

## 2016-11-12 NOTE — Anesthesia Procedure Notes (Signed)
Performed by: Charlie Seda Pre-anesthesia Checklist: Patient identified, Emergency Drugs available, Patient being monitored, Suction available and Timeout performed Patient Re-evaluated:Patient Re-evaluated prior to inductionOxygen Delivery Method: Nasal cannula Intubation Type: IV induction       

## 2016-11-12 NOTE — H&P (Signed)
Outpatient short stay form Pre-procedure 11/12/2016 1:10 PM Christena DeemMartin U Skulskie MD  Primary Physician: Ruthe Mannanalia Aron, M.D.  Reason for visit:  EGD and colonoscopy  History of present illness:  Patient is a 45 year old female presenting today for further evaluation of diarrhea and lower abdominal pain. She's also had some weight loss mild dyspepsia as well as a finding of iron deficiency B-12 and folate deficiency. She has been evaluated serologically for celiac sprue which has been negative. She takes occasional NSAIDs. She tolerated her prep well. She takes no other aspirin products or blood thinning agents.    Current Facility-Administered Medications:  .  0.9 %  sodium chloride infusion, , Intravenous, Continuous, Christena DeemMartin U Skulskie, MD  Prescriptions Prior to Admission  Medication Sig Dispense Refill Last Dose  . cyanocobalamin (,VITAMIN B-12,) 1000 MCG/ML injection INJECT 1 ML (1,000 MCG TOTAL) INTO THE MUSCLE EVERY 30 (THIRTY) DAYS. 1 mL 6 Past Month at Unknown time  . folic acid (FOLVITE) 400 MCG tablet Take by mouth.   Past Month at Unknown time  . ferrous sulfate 325 (65 FE) MG tablet Take by mouth.   Not Taking at Unknown time     Allergies  Allergen Reactions  . Ciprofloxacin      Past Medical History:  Diagnosis Date  . Anemia   . Gluten intolerance   . Vitamin D deficiency     Review of systems:      Physical Exam    Heart and lungs: Regular rate and rhythm without rub or gallop, lungs are bilaterally clear.    HEENT: Normocephalic atraumatic eyes are anicteric    Other:     Pertinant exam for procedure: Soft nontender nondistended bowel sounds positive normoactive.    Planned proceedures: EGD, colonoscopy and indicated procedures. I have discussed the risks benefits and complications of procedures to include not limited to bleeding, infection, perforation and the risk of sedation and the patient wishes to proceed.    Christena DeemMartin U Skulskie,  MD Gastroenterology 11/12/2016  1:10 PM

## 2016-11-12 NOTE — Anesthesia Preprocedure Evaluation (Signed)
Anesthesia Evaluation  Patient identified by MRN, date of birth, ID band Patient awake    Reviewed: Allergy & Precautions, H&P , NPO status , Patient's Chart, lab work & pertinent test results, reviewed documented beta blocker date and time   Airway Mallampati: II   Neck ROM: full    Dental  (+) Poor Dentition   Pulmonary neg pulmonary ROS,    Pulmonary exam normal        Cardiovascular negative cardio ROS Normal cardiovascular exam Rhythm:regular Rate:Normal     Neuro/Psych negative neurological ROS  negative psych ROS   GI/Hepatic negative GI ROS, Neg liver ROS,   Endo/Other  negative endocrine ROS  Renal/GU negative Renal ROS  negative genitourinary   Musculoskeletal   Abdominal   Peds  Hematology negative hematology ROS (+) anemia ,   Anesthesia Other Findings Past Medical History: No date: Anemia No date: Gluten intolerance No date: Vitamin D deficiency Past Surgical History: 06/12/2001: TUBAL LIGATION   Reproductive/Obstetrics negative OB ROS                             Anesthesia Physical Anesthesia Plan  ASA: II  Anesthesia Plan: General   Post-op Pain Management:    Induction:   Airway Management Planned:   Additional Equipment:   Intra-op Plan:   Post-operative Plan:   Informed Consent: I have reviewed the patients History and Physical, chart, labs and discussed the procedure including the risks, benefits and alternatives for the proposed anesthesia with the patient or authorized representative who has indicated his/her understanding and acceptance.   Dental Advisory Given  Plan Discussed with: CRNA  Anesthesia Plan Comments:         Anesthesia Quick Evaluation

## 2016-11-12 NOTE — Op Note (Signed)
[facility] Gastroenterology Patient Name: Shari GobbleKaren Grygiel Procedure Date: 11/12/2016 1:15 PM MRN: [id number] Account #: [id number] Date of Birth: 08/31/1971 Admit Type: Outpatient Age: [id number] Room: [facility] ENDO ROOM 3 Gender: Female Note Status: Finalized Procedure:            Upper GI endoscopy Indications:          Iron deficiency anemia, Dyspepsia, Diarrhea Providers:            Christena DeemMartin U. Milessa Hogan, MD Referring MD:         No Local Md, MD (Referring MD) Medicines:            Monitored Anesthesia Care Complications:        No immediate complications. Procedure:            Pre-Anesthesia Assessment:                       - ASA Grade Assessment: II - A patient with mild                        systemic disease.                       After obtaining informed consent, the endoscope was                        passed under direct vision. Throughout the procedure,                        the patient's blood pressure, pulse, and oxygen                        saturations were monitored continuously. The Endoscope                        was introduced through the mouth, and advanced to the                        third part of duodenum. The upper GI endoscopy was                        accomplished without difficulty. The patient tolerated                        the procedure well. Findings:      The Z-line was regular. Biopsies were taken with a cold forceps for       histology.      The exam of the esophagus was otherwise normal.      The entire examined stomach was normal. Biopsies were taken with a cold       forceps for histology.      The cardia and gastric fundus were normal on retroflexion.      The examined duodenum was normal. Biopsies were taken with a cold       forceps for histology. Impression:           - Z-line regular. Biopsied.                       - Normal stomach. Biopsied.                       -  Normal examined duodenum. Biopsied. Recommendation:       -  continue current diet.                       - Return to GI clinic in 3 weeks.                       - Await pathology results. Procedure Code(s):    --- Professional ---                       [phone], Esophagogastroduodenoscopy, flexible, transoral;                        with biopsy, single or multiple Diagnosis Code(s):    --- Professional ---                       D50.9, Iron deficiency anemia, unspecified                       R10.13, Epigastric pain                       R19.7, Diarrhea, unspecified CPT copyright 2016 American Medical Association. All rights reserved. The codes documented in this report are preliminary and upon coder review may  be revised to meet current compliance requirements. Christena DeemMartin U Pennelope Basque, MD 11/12/2016 1:36:21 PM This report has been signed electronically. Number of Addenda: 0 Note Initiated On: 11/12/2016 1:15 PM      [facility]

## 2016-11-13 ENCOUNTER — Other Ambulatory Visit: Payer: Self-pay | Admitting: Family Medicine

## 2016-11-13 DIAGNOSIS — Z808 Family history of malignant neoplasm of other organs or systems: Secondary | ICD-10-CM

## 2016-11-14 ENCOUNTER — Encounter: Payer: Self-pay | Admitting: Gastroenterology

## 2016-11-14 LAB — SURGICAL PATHOLOGY

## 2016-11-14 NOTE — Anesthesia Postprocedure Evaluation (Signed)
Anesthesia Post Note  Patient: Solmon IceKaren L Quilling  Procedure(s) Performed: Procedure(s) (LRB): COLONOSCOPY WITH PROPOFOL (N/A) ESOPHAGOGASTRODUODENOSCOPY (EGD) WITH PROPOFOL (N/A)  Patient location during evaluation: PACU Anesthesia Type: General Level of consciousness: awake and alert Pain management: pain level controlled Vital Signs Assessment: post-procedure vital signs reviewed and stable Respiratory status: spontaneous breathing, nonlabored ventilation, respiratory function stable and patient connected to nasal cannula oxygen Cardiovascular status: blood pressure returned to baseline and stable Postop Assessment: no signs of nausea or vomiting Anesthetic complications: no    Last Vitals:  Vitals:   11/12/16 1404 11/12/16 1424  BP: 114/60 107/70  Pulse:    Resp:    Temp: (!) 36.1 C     Last Pain:  Vitals:   11/12/16 1404  TempSrc: Tympanic                 Yevette EdwardsJames G Jannely Henthorn

## 2016-11-29 ENCOUNTER — Other Ambulatory Visit: Payer: 59

## 2016-12-05 ENCOUNTER — Inpatient Hospital Stay: Payer: Managed Care, Other (non HMO) | Attending: Hematology and Oncology

## 2016-12-05 DIAGNOSIS — E538 Deficiency of other specified B group vitamins: Secondary | ICD-10-CM

## 2016-12-05 DIAGNOSIS — Z79899 Other long term (current) drug therapy: Secondary | ICD-10-CM | POA: Diagnosis not present

## 2016-12-05 DIAGNOSIS — D72819 Decreased white blood cell count, unspecified: Secondary | ICD-10-CM | POA: Diagnosis not present

## 2016-12-05 DIAGNOSIS — E559 Vitamin D deficiency, unspecified: Secondary | ICD-10-CM | POA: Diagnosis not present

## 2016-12-05 DIAGNOSIS — D519 Vitamin B12 deficiency anemia, unspecified: Secondary | ICD-10-CM

## 2016-12-05 LAB — CBC WITH DIFFERENTIAL/PLATELET
Basophils Absolute: 0 10*3/uL (ref 0–0.1)
Basophils Relative: 1 %
Eosinophils Absolute: 0.1 10*3/uL (ref 0–0.7)
Eosinophils Relative: 2 %
HCT: 38.2 % (ref 35.0–47.0)
Hemoglobin: 13.1 g/dL (ref 12.0–16.0)
Lymphocytes Relative: 38 %
Lymphs Abs: 1.4 10*3/uL (ref 1.0–3.6)
MCH: 31.1 pg (ref 26.0–34.0)
MCHC: 34.2 g/dL (ref 32.0–36.0)
MCV: 90.9 fL (ref 80.0–100.0)
Monocytes Absolute: 0.3 10*3/uL (ref 0.2–0.9)
Monocytes Relative: 9 %
Neutro Abs: 1.8 10*3/uL (ref 1.4–6.5)
Neutrophils Relative %: 50 %
Platelets: 183 10*3/uL (ref 150–440)
RBC: 4.2 MIL/uL (ref 3.80–5.20)
RDW: 12.4 % (ref 11.5–14.5)
WBC: 3.7 10*3/uL (ref 3.6–11.0)

## 2016-12-05 LAB — FERRITIN: Ferritin: 38 ng/mL (ref 11–307)

## 2017-01-01 ENCOUNTER — Other Ambulatory Visit: Payer: Self-pay | Admitting: Obstetrics

## 2017-01-01 DIAGNOSIS — Z1231 Encounter for screening mammogram for malignant neoplasm of breast: Secondary | ICD-10-CM

## 2017-01-29 ENCOUNTER — Ambulatory Visit
Admission: RE | Admit: 2017-01-29 | Discharge: 2017-01-29 | Disposition: A | Discharge status: Home / Self Care | Payer: Managed Care, Other (non HMO) | Source: Ambulatory Visit | Attending: Obstetrics | Admitting: Obstetrics

## 2017-01-29 ENCOUNTER — Ambulatory Visit: Payer: Self-pay

## 2017-01-29 DIAGNOSIS — Z1231 Encounter for screening mammogram for malignant neoplasm of breast: Secondary | ICD-10-CM

## 2017-02-04 ENCOUNTER — Other Ambulatory Visit: Payer: Self-pay | Admitting: Hematology and Oncology

## 2017-02-28 ENCOUNTER — Ambulatory Visit: Payer: 59 | Admitting: Hematology and Oncology

## 2017-02-28 ENCOUNTER — Other Ambulatory Visit: Payer: 59

## 2017-03-11 ENCOUNTER — Ambulatory Visit: Payer: Self-pay | Admitting: Hematology and Oncology

## 2017-03-11 ENCOUNTER — Other Ambulatory Visit: Payer: Managed Care, Other (non HMO)

## 2017-03-14 ENCOUNTER — Inpatient Hospital Stay (HOSPITAL_BASED_OUTPATIENT_CLINIC_OR_DEPARTMENT_OTHER): Payer: 59 | Admitting: Hematology and Oncology

## 2017-03-14 ENCOUNTER — Encounter: Payer: Self-pay | Admitting: Hematology and Oncology

## 2017-03-14 ENCOUNTER — Inpatient Hospital Stay: Payer: 59 | Attending: Hematology and Oncology

## 2017-03-14 ENCOUNTER — Telehealth: Payer: Self-pay | Admitting: *Deleted

## 2017-03-14 VITALS — BP 91/62 | HR 73 | Temp 96.9°F | Resp 18 | Wt 127.4 lb

## 2017-03-14 DIAGNOSIS — E559 Vitamin D deficiency, unspecified: Secondary | ICD-10-CM | POA: Insufficient documentation

## 2017-03-14 DIAGNOSIS — K2950 Unspecified chronic gastritis without bleeding: Secondary | ICD-10-CM | POA: Insufficient documentation

## 2017-03-14 DIAGNOSIS — Z79899 Other long term (current) drug therapy: Secondary | ICD-10-CM | POA: Diagnosis not present

## 2017-03-14 DIAGNOSIS — K5730 Diverticulosis of large intestine without perforation or abscess without bleeding: Secondary | ICD-10-CM

## 2017-03-14 DIAGNOSIS — K219 Gastro-esophageal reflux disease without esophagitis: Secondary | ICD-10-CM | POA: Diagnosis not present

## 2017-03-14 DIAGNOSIS — D72819 Decreased white blood cell count, unspecified: Secondary | ICD-10-CM | POA: Insufficient documentation

## 2017-03-14 DIAGNOSIS — D519 Vitamin B12 deficiency anemia, unspecified: Secondary | ICD-10-CM

## 2017-03-14 DIAGNOSIS — E538 Deficiency of other specified B group vitamins: Secondary | ICD-10-CM | POA: Diagnosis not present

## 2017-03-14 DIAGNOSIS — D509 Iron deficiency anemia, unspecified: Secondary | ICD-10-CM

## 2017-03-14 LAB — CBC WITH DIFFERENTIAL/PLATELET
Basophils Absolute: 0 10*3/uL (ref 0–0.1)
Basophils Relative: 1 %
Eosinophils Absolute: 0.1 10*3/uL (ref 0–0.7)
Eosinophils Relative: 3 %
HCT: 37.5 % (ref 35.0–47.0)
Hemoglobin: 13.1 g/dL (ref 12.0–16.0)
Lymphocytes Relative: 30 %
Lymphs Abs: 1 10*3/uL (ref 1.0–3.6)
MCH: 31 pg (ref 26.0–34.0)
MCHC: 35 g/dL (ref 32.0–36.0)
MCV: 88.5 fL (ref 80.0–100.0)
Monocytes Absolute: 0.3 10*3/uL (ref 0.2–0.9)
Monocytes Relative: 8 %
Neutro Abs: 1.9 10*3/uL (ref 1.4–6.5)
Neutrophils Relative %: 58 %
Platelets: 204 10*3/uL (ref 150–440)
RBC: 4.24 MIL/uL (ref 3.80–5.20)
RDW: 12.5 % (ref 11.5–14.5)
WBC: 3.2 10*3/uL — ABNORMAL LOW (ref 3.6–11.0)

## 2017-03-14 LAB — FERRITIN: Ferritin: 24 ng/mL (ref 11–307)

## 2017-03-14 LAB — VITAMIN B12: Vitamin B-12: 439 pg/mL (ref 180–914)

## 2017-03-14 LAB — FOLATE: Folate: 17.9 ng/mL (ref >5.9)

## 2017-03-14 NOTE — Telephone Encounter (Signed)
Called patient to inform her that her ferritin level is low.

## 2017-03-14 NOTE — Progress Notes (Signed)
[facility]-  Cancer Center  Clinic day:  03/14/17   Chief Complaint: Shari Vasquez is a 46 y.o. female with B12 and folic acid deficiency who is seen for 6 month assessment.  HPI:  The patient was last seen in the hematology clinic on 08/29/2016.  At that time, she had worsening diarrhea.  She had been unable to take oral iron.  Colonoscopy was planned.  She had lost 7 pounds in 3 months.  Colonoscopy by Dr. Barnetta Chapel on 11/12/2016 revealed diverticulosis in the sigmoid and descending colon.  The entire colon was normal.  Random biopsies from the right and left colon revealed no pathologic change.  EGD on 11/12/2016 was normal.  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  Biopsies in the stomach revealed minimal chronic gastritis.  Biopsies were negative in the duodenum.  CBC on 12/05/2016 revealed a hematocrit of 38.2, hemoglobin 13.1, and MCV 90.9.  Ferritin was 38.  During the interim, she was seen by gynecology. She notes really heavy and long periods. She has an IUD and still has some spotting.  She denies any infections. She denies any new medications or herbal products. She is taking folate a few times a week.  She believes she has irritable bowel syndrome. She does not have celiac disease.   Past Medical History:  Diagnosis Date  . Anemia   . Gluten intolerance   . Vitamin D deficiency     Past Surgical History:  Procedure Laterality Date  . COLONOSCOPY WITH PROPOFOL N/A 11/12/2016   Procedure: COLONOSCOPY WITH PROPOFOL;  Surgeon: Christena Deem, MD;  Location: [facility] ENDOSCOPY;  Service: Endoscopy;  Laterality: N/A;  . ESOPHAGOGASTRODUODENOSCOPY (EGD) WITH PROPOFOL N/A 11/12/2016   Procedure: ESOPHAGOGASTRODUODENOSCOPY (EGD) WITH PROPOFOL;  Surgeon: Christena Deem, MD;  Location: [facility] ENDOSCOPY;  Service: Endoscopy;  Laterality: N/A;  . TUBAL LIGATION  06/12/2001    Family History  Problem Relation Age of Onset  .  Cancer Mother     thyroid  . Cancer Paternal Grandfather 68    Lung  . Cancer Other     Breast (Great Aunt)    Social History:  reports that she has never smoked. She does not have any smokeless tobacco history on file. She reports that she does not drink alcohol or use drugs.  She lives in [place]. The patient is alone today.  Allergies:  Allergies  Allergen Reactions  . Ciprofloxacin     Current Medications: Current Outpatient Prescriptions  Medication Sig Dispense Refill  . cyanocobalamin (,VITAMIN B-12,) 1000 MCG/ML injection INJECT 1 ML (1,000 MCG TOTAL) INTO THE MUSCLE EVERY 30 (THIRTY) DAYS. 1 mL 2  . ferrous sulfate 325 (65 FE) MG tablet Take by mouth.    . folic acid (FOLVITE) 400 MCG tablet Take by mouth.    . Probiotic Product (PHILLIPS COLON HEALTH PO) Take 1 tablet by mouth daily.     No current facility-administered medications for this visit.     Review of Systems:  GENERAL:  Feels "ok".  No fevers or sweats.  Weight up 3 pounds. PERFORMANCE STATUS (ECOG):  0 HEENT:  No visual changes, runny nose, sore throat, mouth sores or tenderness. Lungs: No shortness of breath or cough.  No hemoptysis. Cardiac:  No chest pain, palpitations, orthopnea, or PND. GI:  Ongoing GI issues (see HPI).  No appetite.  No nausea, vomiting, constipation, melena or hematochezia. GU:  No urgency, frequency, dysuria, or hematuria.  Spotting since IUD (see  HPI). Musculoskeletal:  No back pain.  No joint pain.  No muscle tenderness. Extremities:  No pain or swelling. Skin:  No rashes or skin changes. Neuro:  No headache, numbness or weakness, balance or coordination issues. Endocrine:  No diabetes, thyroid issues, hot flashes or night sweats. Psych:  No mood changes, depression or anxiety. Pain:  No focal pain. Review of systems:  All other systems reviewed and found to be negative.  Physical Exam: Blood pressure 91/62, pulse 73, temperature (!) 96.9 F (36.1 C), temperature source  Tympanic, resp. rate 18, weight 127 lb 7 oz (57.8 kg). GENERAL:  Thin woman sitting comfortably in the exam room in no acute distress. MENTAL STATUS:  Alert and oriented to person, place and time. HEAD:  Short black spiked hair.  Normocephalic, atraumatic, face symmetric, no Cushingoid features. EYES:  Glasses.  Brown eyes.  Pupils equal round and reactive to light and accomodation.  No conjunctivitis or scleral icterus. ENT:  Oropharynx clear without lesion.  Tongue normal. Mucous membranes moist.  RESPIRATORY:  Clear to auscultation without rales, wheezes or rhonchi. CARDIOVASCULAR:  Regular rate and rhythm without murmur, rub or gallop. ABDOMEN:  Soft, non-tender, with active bowel sounds, and no hepatosplenomegaly.  No masses. SKIN:  No rashes, ulcers or lesions. EXTREMITIES: No edema, no skin discoloration or tenderness.  No palpable cords. LYMPH NODES: No palpable cervical, supraclavicular, axillary or inguinal adenopathy  NEUROLOGICAL: Unremarkable. PSYCH:  Appropriate.   Appointment on 03/14/2017  Component Date Value Ref Range Status  . WBC 03/14/2017 3.2* 3.6 - 11.0 K/uL Final  . RBC 03/14/2017 4.24  3.80 - 5.20 MIL/uL Final  . Hemoglobin 03/14/2017 13.1  12.0 - 16.0 g/dL Final  . HCT 16/09/9603 37.5  35.0 - 47.0 % Final  . MCV 03/14/2017 88.5  80.0 - 100.0 fL Final  . MCH 03/14/2017 31.0  26.0 - 34.0 pg Final  . MCHC 03/14/2017 35.0  32.0 - 36.0 g/dL Final  . RDW 54/08/8118 12.5  11.5 - 14.5 % Final  . Platelets 03/14/2017 204  150 - 440 K/uL Final  . Neutrophils Relative % 03/14/2017 58  % Final  . Neutro Abs 03/14/2017 1.9  1.4 - 6.5 K/uL Final  . Lymphocytes Relative 03/14/2017 30  % Final  . Lymphs Abs 03/14/2017 1.0  1.0 - 3.6 K/uL Final  . Monocytes Relative 03/14/2017 8  % Final  . Monocytes Absolute 03/14/2017 0.3  0.2 - 0.9 K/uL Final  . Eosinophils Relative 03/14/2017 3  % Final  . Eosinophils Absolute 03/14/2017 0.1  0 - 0.7 K/uL Final  . Basophils Relative  03/14/2017 1  % Final  . Basophils Absolute 03/14/2017 0.0  0 - 0.1 K/uL Final    Assessment:  Shari Vasquez is a 46 y.o. 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  WBC was 2900 in 09/15/2012.  She has had no infections.  WBC has normalized.  She has B12 deficiency and folic acid deficiency.  She has received monthly B12 for 2 years (last 01/25/2016) then switched to self administration.  She began folic acid on 14/78/2956.  She takes folic acid 3 times/week.  She is eating meat 3 times/week.  B12 and folate were normal on 03/14/2017  Colonoscopy on 11/12/2016 revealed diverticulosis in the sigmoid and descending colon.  The entire colon was normal.  Random biopsies from the right and left colon revealed no pathologic change.  EGD on 11/12/2016 was normal.  GE  junction biopsies revealed changes of reflux gastroesophagitis.  Biopsies in the stomach revealed minimal chronic gastritis.  Biopsies were negative in the duodenum.  Symptomatically, she denies any new complaints.  She has some vaginal spotting s/p IUD.  Exam is unremarkable.  Hematocrit is 37.5 with a hemoglobin of 13.1.  Ferritin is 24.  Plan: 1.  Labs today:  CBC with diff, ferritin, B12, folate. 2.  Continue B12 monthly.   3.  Continue folic acid 1 mg a day.   4.  Continue iron rich food. 5.  RTC in 3 months for labs (CBC with diff, ferritin) 6.  RTC in 6 months for MD assessment, labs (CBC with diff, ferritin, B12, folate).   Rosey Bath, MD  03/14/2017,10:14 AM

## 2017-03-14 NOTE — Progress Notes (Signed)
Patient's BP decreased today.  She is a runner and has run this morning.  States since fall of last year she has noticed her BP [place] lower in the morning.  Orthostatic BP's obtained. Patient offers no complaints.

## 2017-03-26 ENCOUNTER — Encounter: Payer: Self-pay | Admitting: Family Medicine

## 2017-04-02 ENCOUNTER — Other Ambulatory Visit: Payer: Self-pay | Admitting: *Deleted

## 2017-04-02 NOTE — Telephone Encounter (Signed)
Not due

## 2017-04-03 ENCOUNTER — Other Ambulatory Visit: Payer: Self-pay | Admitting: *Deleted

## 2017-04-03 MED ORDER — CYANOCOBALAMIN 1000 MCG/ML IJ SOLN: 1000.0000 ug | INTRAMUSCULAR | 1 refills | Status: DC

## 2017-04-09 ENCOUNTER — Encounter: Payer: Self-pay | Admitting: Hematology and Oncology

## 2017-06-12 ENCOUNTER — Inpatient Hospital Stay: Payer: 59 | Attending: Hematology and Oncology

## 2017-06-12 DIAGNOSIS — D72819 Decreased white blood cell count, unspecified: Secondary | ICD-10-CM | POA: Diagnosis not present

## 2017-06-12 DIAGNOSIS — E538 Deficiency of other specified B group vitamins: Secondary | ICD-10-CM | POA: Insufficient documentation

## 2017-06-12 DIAGNOSIS — K2950 Unspecified chronic gastritis without bleeding: Secondary | ICD-10-CM | POA: Insufficient documentation

## 2017-06-12 DIAGNOSIS — D509 Iron deficiency anemia, unspecified: Secondary | ICD-10-CM

## 2017-06-12 DIAGNOSIS — Z79899 Other long term (current) drug therapy: Secondary | ICD-10-CM | POA: Diagnosis not present

## 2017-06-12 DIAGNOSIS — E559 Vitamin D deficiency, unspecified: Secondary | ICD-10-CM | POA: Insufficient documentation

## 2017-06-12 DIAGNOSIS — K5730 Diverticulosis of large intestine without perforation or abscess without bleeding: Secondary | ICD-10-CM | POA: Insufficient documentation

## 2017-06-12 DIAGNOSIS — K219 Gastro-esophageal reflux disease without esophagitis: Secondary | ICD-10-CM | POA: Insufficient documentation

## 2017-06-12 LAB — CBC WITH DIFFERENTIAL/PLATELET
Basophils Absolute: 0 10*3/uL (ref 0–0.1)
Basophils Relative: 1 %
Eosinophils Absolute: 0.1 10*3/uL (ref 0–0.7)
Eosinophils Relative: 4 %
HCT: 35.2 % (ref 35.0–47.0)
Hemoglobin: 12.3 g/dL (ref 12.0–16.0)
Lymphocytes Relative: 35 %
Lymphs Abs: 1.3 10*3/uL (ref 1.0–3.6)
MCH: 30.5 pg (ref 26.0–34.0)
MCHC: 34.8 g/dL (ref 32.0–36.0)
MCV: 87.5 fL (ref 80.0–100.0)
Monocytes Absolute: 0.3 10*3/uL (ref 0.2–0.9)
Monocytes Relative: 9 %
Neutro Abs: 1.9 10*3/uL (ref 1.4–6.5)
Neutrophils Relative %: 51 %
Platelets: 173 10*3/uL (ref 150–440)
RBC: 4.03 MIL/uL (ref 3.80–5.20)
RDW: 13 % (ref 11.5–14.5)
WBC: 3.6 10*3/uL (ref 3.6–11.0)

## 2017-06-12 LAB — FERRITIN: Ferritin: 31 ng/mL (ref 11–307)

## 2017-06-13 ENCOUNTER — Inpatient Hospital Stay: Payer: 59

## 2017-09-15 ENCOUNTER — Inpatient Hospital Stay: Payer: 59 | Admitting: Hematology and Oncology

## 2017-09-15 ENCOUNTER — Inpatient Hospital Stay: Payer: 59

## 2017-09-17 NOTE — Progress Notes (Signed)
[facility]-  Cancer Center  Clinic day:  09/18/17   Chief Complaint: Shari Vasquez is a 46 y.o. female with B12 and folic acid deficiency who is seen for 6 month assessment.  HPI:  The patient was last seen in the hematology clinic on 03/14/2017.  At that time, patient was having continued issues with her bowels, questionable IBS. Patient does not have celiac disease. Patient was reporting heavy menstrual cycles. She had been seen by gynecology for evaluation of her IUD placement.  She denied any new medications or herbal products. He was taking her folic acid only a few times a week. Exam was stable. CBC was unremarkable. Ferritin was 24.  B12 level was 439. Folate was normal at 17.9.  Labs on 06/12/2017 revealed a WBC of 3600 with an ANC of 1900. Hemoglobin was 12.3, hematocrit 35.2, MCV 87.5, and platelets 173,000. Ferritin was 31.  During the interim, patient is doing well. She denies physical complaints. Her previously reported bowel issues (diarrhea) have resolved with a low-FODMAP diet. Patient has not had a "true period" since she had IUD placed back in thespring.  Menstrual cycles comes about every 6-8 weeks, and she only experiences "spotting".  Patient is eating "a lot of red meat".    Past Medical History:  Diagnosis Date  . Anemia   . Gluten intolerance   . Vitamin D deficiency     Past Surgical History:  Procedure Laterality Date  . COLONOSCOPY WITH PROPOFOL N/A 11/12/2016   Procedure: COLONOSCOPY WITH PROPOFOL;  Surgeon: Christena DeemMartin U Skulskie, MD;  [facility] ENDOSCOPY;  Service: Endoscopy;  Laterality: N/A;  . ESOPHAGOGASTRODUODENOSCOPY (EGD) WITH PROPOFOL N/A 11/12/2016   Procedure: ESOPHAGOGASTRODUODENOSCOPY (EGD) WITH PROPOFOL;  Surgeon: Christena DeemMartin U Skulskie, MD;  [facility]COPY;  Service: Endoscopy;  Laterality: N/A;  . TUBAL LIGATION  06/12/2001    Family History  Problem Relation Age of Onset  . Cancer Mother        thyroid  .  Cancer Paternal Grandfather 2165       Lung  . Cancer Other        Breast (Great Aunt)    Social History:  reports that she has never smoked. She has never used smokeless tobacco. She reports that she does not drink alcohol or use drugs.  She [place] in [place]. The patient is alone today.  Allergies:  Allergies  Allergen Reactions  . Ciprofloxacin     Current Medications: Current Outpatient Prescriptions  Medication Sig Dispense Refill  . cyanocobalamin (,VITAMIN B-12,) 1000 MCG/ML injection Inject 1 mL (1,000 mcg total) into the muscle every 30 (thirty) days. 3 mL 1  . folic acid (FOLVITE) 400 MCG tablet Take by mouth.     No current facility-administered medications for this visit.     Review of Systems:  GENERAL:  Feels "good".  No problems.  No fevers or sweats.  Weight down 2 pounds.  PERFORMANCE STATUS (ECOG):  0 HEENT:  No visual changes, runny nose, sore throat, mouth sores or tenderness. Lungs: No shortness of breath or cough.  No hemoptysis. Cardiac:  No chest pain, palpitations, orthopnea, or PND. GI:  Eating a low-FODMAP diet.  No nausea, vomiting, constipation, melena or hematochezia. GU:  No urgency, frequency, dysuria, or hematuria.  Menses every 6-8 weeks. Musculoskeletal:  No back pain.  No joint pain.  No muscle tenderness. Extremities:  No pain or swelling. Skin:  No rashes or skin changes. Neuro:  No headache, numbness or weakness,  balance or coordination issues. Endocrine:  No diabetes, thyroid issues, hot flashes or night sweats. Psych:  No mood changes, depression or anxiety. Pain:  No focal pain. Review of systems:  All other systems reviewed and found to be negative.  Physical Exam: Blood pressure 103/71, pulse (!) 58, temperature (!) 97.2 F (36.2 C), temperature source Tympanic, resp. rate 12, weight 125 lb (56.7 kg). GENERAL:  Thin woman sitting comfortably in the exam room in no acute distress. MENTAL STATUS:  Alert and oriented to person,  place and time. HEAD:  Short black spiked hair.  Normocephalic, atraumatic, face symmetric, no Cushingoid features. EYES:  Glasses.  Brown eyes.  Pupils equal round and reactive to light and accomodation.  No conjunctivitis or scleral icterus. ENT:  Oropharynx clear without lesion.  Tongue normal. Mucous membranes moist.  RESPIRATORY:  Clear to auscultation without rales, wheezes or rhonchi. CARDIOVASCULAR:  Regular rate and rhythm without murmur, rub or gallop. ABDOMEN:  Soft, non-tender, with active bowel sounds, and no hepatosplenomegaly.  No masses. SKIN:  No rashes, ulcers or lesions. EXTREMITIES: No edema, no skin discoloration or tenderness.  No palpable cords. LYMPH NODES: No palpable cervical, supraclavicular, axillary or inguinal adenopathy  NEUROLOGICAL: Unremarkable. PSYCH:  Appropriate.   Orders Only on 09/18/2017  Component Date Value Ref Range Status  . WBC 09/18/2017 3.5* 3.6 - 11.0 K/uL Final  . RBC 09/18/2017 4.43  3.80 - 5.20 MIL/uL Final  . Hemoglobin 09/18/2017 13.7  12.0 - 16.0 g/dL Final  . HCT 16/09/9603 40.0  35.0 - 47.0 % Final  . MCV 09/18/2017 90.3  80.0 - 100.0 fL Final  . MCH 09/18/2017 30.9  26.0 - 34.0 pg Final  . MCHC 09/18/2017 34.2  32.0 - 36.0 g/dL Final  . RDW 54/08/8118 12.3  11.5 - 14.5 % Final  . Platelets 09/18/2017 209  150 - 440 K/uL Final  . Neutrophils Relative % 09/18/2017 49  % Final  . Neutro Abs 09/18/2017 1.7  1.4 - 6.5 K/uL Final  . Lymphocytes Relative 09/18/2017 38  % Final  . Lymphs Abs 09/18/2017 1.3  1.0 - 3.6 K/uL Final  . Monocytes Relative 09/18/2017 9  % Final  . Monocytes Absolute 09/18/2017 0.3  0.2 - 0.9 K/uL Final  . Eosinophils Relative 09/18/2017 3  % Final  . Eosinophils Absolute 09/18/2017 0.1  0 - 0.7 K/uL Final  . Basophils Relative 09/18/2017 1  % Final  . Basophils Absolute 09/18/2017 0.0  0 - 0.1 K/uL Final    Assessment:  Shari Vasquez is a 46 y.o. 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  WBC was 2900 in 09/15/2012.  She has had no infections.  WBC has normalized.  She has B12 deficiency and folic acid deficiency.  She has received monthly B12 for 2 years (last 01/25/2016) then switched to self administration.  She began folic acid on 14/78/2956.  She takes folic acid 3 times/week.  She is eating meat 3 times/week.  B12 and folate were normal on 09/18/2017  Ferritin has been followed: 7 on 02/29/2016, 15 on 05/28/2016, 15 on 09/03/2016, 38 on 12/05/2016, 24 on 03/14/2017, 31 on 06/12/2017, and 33 on 09/18/2017.  Colonoscopy on 11/12/2016 revealed diverticulosis in the sigmoid and descending colon.  The entire colon was normal.  Random biopsies from the right and left colon revealed no pathologic change.  EGD on 11/12/2016 was normal.  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  Biopsies in the stomach revealed minimal chronic gastritis.  Biopsies were negative in the duodenum.  She is on a low-FODMAP diet.  Symptomatically, she denies any new complaints.  She has some vaginal spotting s/p IUD.  Exam is unremarkable.  Hematocrit is 40.0 with a hemoglobin of 13.7.    Plan: 1.  Labs today:  CBC with diff, ferritin, B12, folate. 2.  Continue B12 monthly.   3.  Continue folic acid 1 mg a day.   4.  Continue iron rich food. 5.  RTC in 3 months for labs (CBC with diff, ferritin) 6.  RTC in 6 months for MD assessment, labs (CBC with diff, ferritin, B12, folate).   Quentin Mulling, NP  09/18/2017,2:49 PM   I saw and evaluated the patient, participating in the key portions of the service and reviewing pertinent diagnostic studies and records.  I reviewed the nurse practitioner's note and agree with the findings and the plan.  The assessment and plan were discussed with the patient.  A few questions were asked by the patient and answered.   Nelva Nay, MD 09/18/2017,2:49 PM

## 2017-09-18 ENCOUNTER — Inpatient Hospital Stay: Payer: 59 | Attending: Hematology and Oncology

## 2017-09-18 ENCOUNTER — Telehealth: Payer: Self-pay | Admitting: Hematology and Oncology

## 2017-09-18 ENCOUNTER — Inpatient Hospital Stay (HOSPITAL_BASED_OUTPATIENT_CLINIC_OR_DEPARTMENT_OTHER): Payer: 59 | Admitting: Hematology and Oncology

## 2017-09-18 ENCOUNTER — Other Ambulatory Visit: Payer: Self-pay

## 2017-09-18 ENCOUNTER — Encounter: Payer: Self-pay | Admitting: Hematology and Oncology

## 2017-09-18 VITALS — BP 103/71 | HR 58 | Temp 97.2°F | Resp 12 | Wt 125.0 lb

## 2017-09-18 DIAGNOSIS — E559 Vitamin D deficiency, unspecified: Secondary | ICD-10-CM | POA: Insufficient documentation

## 2017-09-18 DIAGNOSIS — K2950 Unspecified chronic gastritis without bleeding: Secondary | ICD-10-CM | POA: Diagnosis not present

## 2017-09-18 DIAGNOSIS — K219 Gastro-esophageal reflux disease without esophagitis: Secondary | ICD-10-CM | POA: Insufficient documentation

## 2017-09-18 DIAGNOSIS — Z79899 Other long term (current) drug therapy: Secondary | ICD-10-CM | POA: Insufficient documentation

## 2017-09-18 DIAGNOSIS — K5730 Diverticulosis of large intestine without perforation or abscess without bleeding: Secondary | ICD-10-CM | POA: Insufficient documentation

## 2017-09-18 DIAGNOSIS — D72819 Decreased white blood cell count, unspecified: Secondary | ICD-10-CM | POA: Diagnosis not present

## 2017-09-18 DIAGNOSIS — E538 Deficiency of other specified B group vitamins: Secondary | ICD-10-CM | POA: Insufficient documentation

## 2017-09-18 DIAGNOSIS — D509 Iron deficiency anemia, unspecified: Secondary | ICD-10-CM

## 2017-09-18 LAB — CBC WITH DIFFERENTIAL/PLATELET
Basophils Absolute: 0 10*3/uL (ref 0–0.1)
Basophils Relative: 1 %
Eosinophils Absolute: 0.1 10*3/uL (ref 0–0.7)
Eosinophils Relative: 3 %
HCT: 40 % (ref 35.0–47.0)
Hemoglobin: 13.7 g/dL (ref 12.0–16.0)
Lymphocytes Relative: 38 %
Lymphs Abs: 1.3 10*3/uL (ref 1.0–3.6)
MCH: 30.9 pg (ref 26.0–34.0)
MCHC: 34.2 g/dL (ref 32.0–36.0)
MCV: 90.3 fL (ref 80.0–100.0)
Monocytes Absolute: 0.3 10*3/uL (ref 0.2–0.9)
Monocytes Relative: 9 %
Neutro Abs: 1.7 10*3/uL (ref 1.4–6.5)
Neutrophils Relative %: 49 %
Platelets: 209 10*3/uL (ref 150–440)
RBC: 4.43 MIL/uL (ref 3.80–5.20)
RDW: 12.3 % (ref 11.5–14.5)
WBC: 3.5 10*3/uL — ABNORMAL LOW (ref 3.6–11.0)

## 2017-09-18 LAB — FOLATE: Folate: 54.6 ng/mL (ref >5.9)

## 2017-09-18 LAB — VITAMIN B12: Vitamin B-12: 503 pg/mL (ref 180–914)

## 2017-09-18 LAB — FERRITIN: Ferritin: 33 ng/mL (ref 11–307)

## 2017-09-18 NOTE — Progress Notes (Signed)
Patient here for follow up with labs today. She denies having any pain. She has already received her Flu Vaccine.

## 2017-09-18 NOTE — Telephone Encounter (Signed)
Per patient, B-12 injections are administered at home by herself and Dr Merlene Pullingorcoran sends a Rx to her Pharmacy.

## 2017-10-02 ENCOUNTER — Other Ambulatory Visit: Payer: Self-pay | Admitting: Hematology and Oncology

## 2017-10-29 ENCOUNTER — Ambulatory Visit (INDEPENDENT_AMBULATORY_CARE_PROVIDER_SITE_OTHER): Payer: 59 | Admitting: Nurse Practitioner

## 2017-10-29 ENCOUNTER — Encounter: Payer: Self-pay | Admitting: Nurse Practitioner

## 2017-10-29 VITALS — BP 108/74 | HR 57 | Temp 97.7°F | Ht 65.0 in | Wt 119.0 lb

## 2017-10-29 DIAGNOSIS — J0100 Acute maxillary sinusitis, unspecified: Secondary | ICD-10-CM | POA: Diagnosis not present

## 2017-10-29 MED ORDER — GUAIFENESIN ER 600 MG PO TB12: 600.0000 mg | ORAL_TABLET | Freq: Two times a day (BID) | ORAL | 0 refills | Status: DC | PRN

## 2017-10-29 MED ORDER — SALINE SPRAY 0.65 % NA SOLN: 1.0000 | NASAL | 0 refills | Status: DC | PRN

## 2017-10-29 MED ORDER — AMOXICILLIN-POT CLAVULANATE 875-125 MG PO TABS: 1.0000 | ORAL_TABLET | Freq: Two times a day (BID) | ORAL | 0 refills | Status: DC

## 2017-10-29 MED ORDER — OXYMETAZOLINE HCL 0.05 % NA SOLN: 1.0000 | Freq: Two times a day (BID) | NASAL | 0 refills | Status: DC

## 2017-10-29 MED ORDER — FLUTICASONE PROPIONATE 50 MCG/ACT NA SUSP
2.0000 | Freq: Every day | NASAL | 0 refills | Status: DC
Start: 2017-10-29 — End: 2017-11-24

## 2017-10-29 NOTE — Progress Notes (Signed)
Subjective:  Patient ID: Shari Vasquez, female    DOB: 06/21/1971  Age: 46 y.o. MRN: [id number]  CC: Sore Throat (sore throat,sinus,coughing yellow mucus at times,mucus in eyes, WBC is low. 8 days. )   Sinusitis  This is a new problem. The current episode started 1 to 4 weeks ago. The problem has been gradually worsening since onset. There has been no fever. Associated symptoms include chills, congestion, coughing, ear pain, headaches, a hoarse voice, sinus pressure, a sore throat and swollen glands. Pertinent negatives include no shortness of breath. Past treatments include acetaminophen and oral decongestants. The treatment provided no relief.   Outpatient Medications Prior to Visit  Medication Sig Dispense Refill  . cyanocobalamin (,VITAMIN B-12,) 1000 MCG/ML injection INJECT 1 ML (1,000 MCG TOTAL) INTO THE MUSCLE EVERY 30 (THIRTY) DAYS. 3 mL 1  . folic acid (FOLVITE) 400 MCG tablet Take by mouth.     No facility-administered medications prior to visit.     ROS See HPI  Objective:  BP 108/74   Pulse (!) 57   Temp 97.7 F (36.5 C)   Ht 5\' 5"  (1.651 m)   Wt 119 lb (54 kg)   SpO2 99%   BMI 19.80 kg/m   BP Readings from Last 3 Encounters:  10/29/17 108/74  09/18/17 103/71  03/14/17 91/62    Wt Readings from Last 3 Encounters:  10/29/17 119 lb (54 kg)  09/18/17 125 lb (56.7 kg)  03/14/17 127 lb 7 oz (57.8 kg)    Physical Exam  Constitutional: She is oriented to person, place, and time.  HENT:  Right Ear: Tympanic membrane, external ear and ear canal normal.  Left Ear: Tympanic membrane, external ear and ear canal normal.  Nose: Mucosal edema and rhinorrhea present. Right sinus exhibits maxillary sinus tenderness. Right sinus exhibits no frontal sinus tenderness. Left sinus exhibits maxillary sinus tenderness. Left sinus exhibits no frontal sinus tenderness.  Mouth/Throat: Uvula is midline. No trismus in the jaw. Posterior oropharyngeal erythema present. No  oropharyngeal exudate.  Eyes: No scleral icterus.  Neck: Normal range of motion. Neck supple.  Cardiovascular: Normal rate and normal heart sounds.  Pulmonary/Chest: Effort normal and breath sounds normal.  Musculoskeletal: She exhibits no edema.  Lymphadenopathy:    She has cervical adenopathy.  Neurological: She is alert and oriented to person, place, and time.  Vitals reviewed.   Lab Results  Component Value Date   WBC 3.5 (L) 09/18/2017   HGB 13.7 09/18/2017   HCT 40.0 09/18/2017   PLT 209 09/18/2017   GLUCOSE 79 08/22/2015   CHOL 123 10/31/2014   TRIG 82.0 10/31/2014   HDL 33.10 (L) 10/31/2014   LDLCALC 74 10/31/2014   ALT 17 08/22/2015   AST 21 08/22/2015   NA 134 (L) 08/22/2015   K 4.1 08/22/2015   CL 101 08/22/2015   CREATININE 0.86 08/22/2015   BUN 16 08/22/2015   CO2 26 08/22/2015   TSH 1.271 08/22/2015    Mm Screening Breast Tomo Bilateral  Result Date: 01/30/2017 CLINICAL DATA:  Screening. EXAM: 2D DIGITAL SCREENING BILATERAL MAMMOGRAM WITH CAD AND ADJUNCT TOMO COMPARISON:  Previous exam(s). ACR Breast Density Category c: The breast tissue is heterogeneously dense, which may obscure small masses. FINDINGS: There are no findings suspicious for malignancy. Images were processed with CAD. IMPRESSION: No mammographic evidence of malignancy. A result letter of this screening mammogram will be mailed directly to the patient. RECOMMENDATION: Screening mammogram in one year. (Code:SM-B-01Y) BI-RADS CATEGORY  1: Negative.  Electronically Signed   By: Harmon PierJeffrey  Hu M.D.   On: 01/30/2017 08:30    Assessment & Plan:   Clydie BraunKaren was seen today for sore throat.  Diagnoses and all orders for this visit:  Acute non-recurrent maxillary sinusitis -     guaiFENesin (MUCINEX) 600 MG 12 hr tablet; Take 1 tablet (600 mg total) by mouth 2 (two) times daily as needed for cough or to loosen phlegm. -     fluticasone (FLONASE) 50 MCG/ACT nasal spray; Place 2 sprays into both nostrils  daily. -     oxymetazoline (AFRIN NASAL SPRAY) 0.05 % nasal spray; Place 1 spray into both nostrils 2 (two) times daily. Use only for 3days, then stop -     sodium chloride (OCEAN) 0.65 % SOLN nasal spray; Place 1 spray into both nostrils as needed for congestion. -     amoxicillin-clavulanate (AUGMENTIN) 875-125 MG tablet; Take 1 tablet by mouth 2 (two) times daily.   I am having Clydie BraunKaren L. Bayman start on guaiFENesin, fluticasone, oxymetazoline, sodium chloride, and amoxicillin-clavulanate. I am also having her maintain her folic acid and cyanocobalamin.  Meds ordered this encounter  Medications  . guaiFENesin (MUCINEX) 600 MG 12 hr tablet    Sig: Take 1 tablet (600 mg total) by mouth 2 (two) times daily as needed for cough or to loosen phlegm.    Dispense:  14 tablet    Refill:  0    Order Specific Question:   Supervising Provider    Answer:   Dianne DunARON, TALIA M [[id number]]  . fluticasone (FLONASE) 50 MCG/ACT nasal spray    Sig: Place 2 sprays into both nostrils daily.    Dispense:  16 g    Refill:  0    Order Specific Question:   Supervising Provider    Answer:   Dianne DunARON, TALIA M [[id number]]  . oxymetazoline (AFRIN NASAL SPRAY) 0.05 % nasal spray    Sig: Place 1 spray into both nostrils 2 (two) times daily. Use only for 3days, then stop    Dispense:  30 mL    Refill:  0    Order Specific Question:   Supervising Provider    Answer:   Dianne DunARON, TALIA M [[id number]]  . sodium chloride (OCEAN) 0.65 % SOLN nasal spray    Sig: Place 1 spray into both nostrils as needed for congestion.    Dispense:  15 mL    Refill:  0    Order Specific Question:   Supervising Provider    Answer:   Dianne DunARON, TALIA M [[id number]]  . amoxicillin-clavulanate (AUGMENTIN) 875-125 MG tablet    Sig: Take 1 tablet by mouth 2 (two) times daily.    Dispense:  14 tablet    Refill:  0    Order Specific Question:   Supervising Provider    Answer:   Dianne DunARON, TALIA M [[id number]]    Follow-up: Return if symptoms worsen or fail to improve.  Alysia Pennaharlotte  Jehan Bonano, NP

## 2017-10-29 NOTE — Patient Instructions (Addendum)
Eye drainage is due to sinus congestion. Avoid touching eye as much as possible. Clean with mild soap and warm water.  URI Instructions: Flonase and Afrin use: apply 1spray of afrin in each nare, wait , then apply 2sprays of flonase in each nare. Use both nasal spray consecutively x 3days, then flonase only for at least 14days.  Encourage adequate oral hydration.  Use over-the-counter  "cold" medicines  such as "Tylenol cold" , "Advil cold",  "Mucinex" or" Mucinex D"  for cough and congestion.  Avoid decongestants if you have high blood pressure. Use" Delsym" or" Robitussin" cough syrup varietis for cough.  You can use plain "Tylenol" or "Advi"l for fever, chills and achyness.

## 2017-11-03 ENCOUNTER — Telehealth: Payer: Self-pay | Admitting: Family Medicine

## 2017-11-03 NOTE — Telephone Encounter (Signed)
Patient called to request to stop augmentin because of the severity of diarrhea the medicine is causing.  She has taken 5 days of the augmentin and had to take 2 immodium tabs with each dose everyday. She reports taking probiotic two hours after each antibiotic dose.  She continues to use flonase and mucinex. She reports her sinus symptoms have 85% improved and the only symptom that remains is slight sinus pressure in the upper sinus cavities with some throat drainage.   She is requesting permission to stop the AUGMENTIN. Please advise via MyChart.

## 2017-11-04 NOTE — Telephone Encounter (Signed)
Pt is aware.  

## 2017-11-04 NOTE — Telephone Encounter (Signed)
Left vm for the pt to call back. Need to inform massage below. CRM

## 2017-11-04 NOTE — Telephone Encounter (Signed)
Yes please stop augmentin. I will not recommended another oral abx at this time till diarrhea resolves.

## 2017-11-22 ENCOUNTER — Other Ambulatory Visit
Admission: RE | Admit: 2017-11-22 | Discharge: 2017-11-22 | Disposition: A | Discharge status: Home / Self Care | Payer: 59 | Source: Ambulatory Visit | Attending: Gastroenterology | Admitting: Gastroenterology

## 2017-11-22 DIAGNOSIS — R197 Diarrhea, unspecified: Secondary | ICD-10-CM | POA: Diagnosis not present

## 2017-11-22 LAB — GASTROINTESTINAL PANEL BY PCR, STOOL (REPLACES STOOL CULTURE)

## 2017-11-22 LAB — C DIFFICILE QUICK SCREEN W PCR REFLEX
C Diff antigen: POSITIVE — AB
C Diff interpretation: DETECTED
C Diff toxin: POSITIVE — AB

## 2017-11-23 ENCOUNTER — Encounter: Payer: Self-pay | Admitting: Nurse Practitioner

## 2017-11-24 ENCOUNTER — Other Ambulatory Visit: Payer: Self-pay | Admitting: Nurse Practitioner

## 2017-11-24 DIAGNOSIS — J0100 Acute maxillary sinusitis, unspecified: Secondary | ICD-10-CM

## 2017-11-24 MED ORDER — FLUTICASONE PROPIONATE 50 MCG/ACT NA SUSP: 2.0000 | Freq: Every day | NASAL | 1 refills | Status: DC

## 2017-12-19 ENCOUNTER — Telehealth: Payer: Self-pay | Admitting: *Deleted

## 2017-12-19 ENCOUNTER — Inpatient Hospital Stay: Payer: 59 | Attending: Hematology and Oncology

## 2017-12-19 DIAGNOSIS — K5730 Diverticulosis of large intestine without perforation or abscess without bleeding: Secondary | ICD-10-CM | POA: Diagnosis not present

## 2017-12-19 DIAGNOSIS — D72819 Decreased white blood cell count, unspecified: Secondary | ICD-10-CM | POA: Insufficient documentation

## 2017-12-19 DIAGNOSIS — Z79899 Other long term (current) drug therapy: Secondary | ICD-10-CM | POA: Insufficient documentation

## 2017-12-19 DIAGNOSIS — K219 Gastro-esophageal reflux disease without esophagitis: Secondary | ICD-10-CM | POA: Insufficient documentation

## 2017-12-19 DIAGNOSIS — E538 Deficiency of other specified B group vitamins: Secondary | ICD-10-CM | POA: Diagnosis not present

## 2017-12-19 DIAGNOSIS — K2950 Unspecified chronic gastritis without bleeding: Secondary | ICD-10-CM | POA: Insufficient documentation

## 2017-12-19 DIAGNOSIS — E559 Vitamin D deficiency, unspecified: Secondary | ICD-10-CM | POA: Diagnosis not present

## 2017-12-19 LAB — CBC WITH DIFFERENTIAL/PLATELET
Basophils Absolute: 0 10*3/uL (ref 0–0.1)
Basophils Relative: 1 %
Eosinophils Absolute: 0.1 10*3/uL (ref 0–0.7)
Eosinophils Relative: 3 %
HCT: 36.4 % (ref 35.0–47.0)
Hemoglobin: 12.3 g/dL (ref 12.0–16.0)
Lymphocytes Relative: 26 %
Lymphs Abs: 1.2 10*3/uL (ref 1.0–3.6)
MCH: 30.5 pg (ref 26.0–34.0)
MCHC: 33.7 g/dL (ref 32.0–36.0)
MCV: 90.6 fL (ref 80.0–100.0)
Monocytes Absolute: 0.4 10*3/uL (ref 0.2–0.9)
Monocytes Relative: 9 %
Neutro Abs: 2.8 10*3/uL (ref 1.4–6.5)
Neutrophils Relative %: 61 %
Platelets: 201 10*3/uL (ref 150–440)
RBC: 4.02 MIL/uL (ref 3.80–5.20)
RDW: 13.1 % (ref 11.5–14.5)
WBC: 4.5 10*3/uL (ref 3.6–11.0)

## 2017-12-19 LAB — FERRITIN: Ferritin: 23 ng/mL (ref 11–307)

## 2017-12-19 NOTE — Telephone Encounter (Signed)
-----   Message from Rosey BathMelissa C Corcoran, MD sent at 12/19/2017  5:02 PM EST ----- Regarding: Please call patient  Please call patient.  Ferritin decreasing.  M  ----- Message ----- From: Interface, Lab In [place] Sent: 12/19/2017   1:32 PM To: Rosey BathMelissa C Corcoran, MD

## 2017-12-19 NOTE — Telephone Encounter (Signed)
Called patient to inform her that her ferritin is decreasing.  Patient states she has had a sinus infection and has been on Amoxil.  She states it gave her "massive diarrhea which turned into C-dif.  She states her appetite has been really off so she knows she is not getting much benefit from her food.  Patient states she does not know if she is symptomatic or not.  Next appointment is 4-18.  Patient can call if she is symptomatic in the meantime.  Verbalized understanding.

## 2017-12-30 ENCOUNTER — Other Ambulatory Visit: Payer: Self-pay | Admitting: Gastroenterology

## 2017-12-30 DIAGNOSIS — R1032 Left lower quadrant pain: Secondary | ICD-10-CM

## 2017-12-30 DIAGNOSIS — R1031 Right lower quadrant pain: Secondary | ICD-10-CM

## 2018-01-07 ENCOUNTER — Ambulatory Visit
Admission: RE | Admit: 2018-01-07 | Discharge: 2018-01-07 | Disposition: A | Discharge status: Home / Self Care | Payer: 59 | Source: Ambulatory Visit | Attending: Gastroenterology | Admitting: Gastroenterology

## 2018-01-07 DIAGNOSIS — R1031 Right lower quadrant pain: Secondary | ICD-10-CM | POA: Diagnosis not present

## 2018-01-07 DIAGNOSIS — R1032 Left lower quadrant pain: Secondary | ICD-10-CM | POA: Diagnosis present

## 2018-01-07 MED ORDER — IOPAMIDOL (ISOVUE-300) INJECTION 61%
75.0000 mL | Freq: Once | INTRAVENOUS | Status: AC | PRN
  Administered 2018-01-07: 75 mL via INTRAVENOUS

## 2018-02-02 ENCOUNTER — Ambulatory Visit: Payer: 59

## 2018-03-16 ENCOUNTER — Telehealth: Payer: Self-pay | Admitting: Hematology and Oncology

## 2018-03-16 NOTE — Telephone Encounter (Signed)
Lab/MD rschd per MD on Call. L/M on pat v/m, also mailed updated appt schd. MF

## 2018-03-19 ENCOUNTER — Ambulatory Visit: Payer: 59 | Admitting: Hematology and Oncology

## 2018-03-19 ENCOUNTER — Other Ambulatory Visit: Payer: 59

## 2018-03-20 ENCOUNTER — Other Ambulatory Visit: Payer: Self-pay | Admitting: Hematology and Oncology

## 2018-03-26 ENCOUNTER — Other Ambulatory Visit: Payer: 59

## 2018-03-26 ENCOUNTER — Ambulatory Visit: Payer: 59 | Admitting: Hematology and Oncology

## 2018-04-10 ENCOUNTER — Other Ambulatory Visit: Payer: Self-pay

## 2018-04-10 ENCOUNTER — Encounter: Payer: Self-pay | Admitting: Hematology and Oncology

## 2018-04-10 ENCOUNTER — Inpatient Hospital Stay (HOSPITAL_BASED_OUTPATIENT_CLINIC_OR_DEPARTMENT_OTHER): Payer: 59 | Admitting: Hematology and Oncology

## 2018-04-10 ENCOUNTER — Inpatient Hospital Stay: Payer: 59 | Attending: Hematology and Oncology

## 2018-04-10 VITALS — BP 105/58 | HR 74 | Temp 97.9°F | Resp 18 | Wt 128.0 lb

## 2018-04-10 DIAGNOSIS — E538 Deficiency of other specified B group vitamins: Secondary | ICD-10-CM | POA: Insufficient documentation

## 2018-04-10 DIAGNOSIS — D72819 Decreased white blood cell count, unspecified: Secondary | ICD-10-CM

## 2018-04-10 DIAGNOSIS — K5730 Diverticulosis of large intestine without perforation or abscess without bleeding: Secondary | ICD-10-CM | POA: Diagnosis not present

## 2018-04-10 DIAGNOSIS — K219 Gastro-esophageal reflux disease without esophagitis: Secondary | ICD-10-CM | POA: Diagnosis not present

## 2018-04-10 DIAGNOSIS — K2950 Unspecified chronic gastritis without bleeding: Secondary | ICD-10-CM | POA: Diagnosis not present

## 2018-04-10 DIAGNOSIS — D509 Iron deficiency anemia, unspecified: Secondary | ICD-10-CM

## 2018-04-10 DIAGNOSIS — E559 Vitamin D deficiency, unspecified: Secondary | ICD-10-CM | POA: Insufficient documentation

## 2018-04-10 DIAGNOSIS — Z79899 Other long term (current) drug therapy: Secondary | ICD-10-CM | POA: Diagnosis not present

## 2018-04-10 LAB — CBC WITH DIFFERENTIAL/PLATELET
Basophils Absolute: 0 10*3/uL (ref 0–0.1)
Basophils Relative: 1 %
Eosinophils Absolute: 0.1 10*3/uL (ref 0–0.7)
Eosinophils Relative: 4 %
HCT: 37.3 % (ref 35.0–47.0)
Hemoglobin: 12.9 g/dL (ref 12.0–16.0)
Lymphocytes Relative: 30 %
Lymphs Abs: 1.2 10*3/uL (ref 1.0–3.6)
MCH: 31.1 pg (ref 26.0–34.0)
MCHC: 34.7 g/dL (ref 32.0–36.0)
MCV: 89.8 fL (ref 80.0–100.0)
Monocytes Absolute: 0.3 10*3/uL (ref 0.2–0.9)
Monocytes Relative: 7 %
Neutro Abs: 2.4 10*3/uL (ref 1.4–6.5)
Neutrophils Relative %: 58 %
Platelets: 190 10*3/uL (ref 150–440)
RBC: 4.15 MIL/uL (ref 3.80–5.20)
RDW: 13.4 % (ref 11.5–14.5)
WBC: 4.1 10*3/uL (ref 3.6–11.0)

## 2018-04-10 LAB — FERRITIN: Ferritin: 17 ng/mL (ref 11–307)

## 2018-04-10 LAB — FOLATE: Folate: 7.9 ng/mL (ref >5.9)

## 2018-04-10 LAB — VITAMIN B12: Vitamin B-12: 340 pg/mL (ref 180–914)

## 2018-04-10 NOTE — Progress Notes (Signed)
[facility]  Clinic day:  04/10/18   Chief Complaint: Shari Vasquez is a 47 y.o. female with B12 and folic acid deficiency who is seen for 6 month assessment.  HPI:  The patient was last seen in the hematology clinic on 09/18/2017.  At that time, she denied any new complaints.  She had some vaginal spotting s/p IUD.  Exam was unremarkable.  Hematocrit was 40.0 with a hemoglobin of 13.7.  B12 was 503.  Folate was 54.6.  Ferritin was 33.  She describes taking an antibiotic for a sinus infection.  She then had a yeast infection.  Stool for C diff was + on 11/22/2017.  CBC on 12/19/2016 revealed a hematocrit of 36.4, hemoglobin 12.3, MCV 90.6, platelets 201,000, WBC 4500 with an ANC of 2800.  Ferritin was 23.  During the interim, patient is doing well today. There are no acute concerns. Patient denies B symptoms and interval infections. She is more active. Patient is training for a triathlon.  She denies recurrent vaginal bleeding.   Patient is eating well. Weight has increased by 9 pounds. Patient denies pain in the clinic today.    Past Medical History:  Diagnosis Date  . Anemia   . Gluten intolerance   . Vitamin D deficiency     Past Surgical History:  Procedure Laterality Date  . COLONOSCOPY WITH PROPOFOL N/A 11/12/2016   Procedure: COLONOSCOPY WITH PROPOFOL;  Surgeon: Christena Deem, MD;  Location: [facility] ENDOSCOPY;  Service: Endoscopy;  Laterality: N/A;  . ESOPHAGOGASTRODUODENOSCOPY (EGD) WITH PROPOFOL N/A 11/12/2016   Procedure: ESOPHAGOGASTRODUODENOSCOPY (EGD) WITH PROPOFOL;  Surgeon: Christena Deem, MD;  Location: [facility] ENDOSCOPY;  Service: Endoscopy;  Laterality: N/A;  . TUBAL LIGATION  06/12/2001    Family History  Problem Relation Age of Onset  . Cancer Mother        thyroid  . Cancer Paternal Grandfather 64       Lung  . Cancer Other        Breast (Great Aunt)    Social History:  reports that she has never smoked. She has  never used smokeless tobacco. She reports that she does not drink alcohol or use drugs.  She lives in [place]. The patient is alone today.  Allergies:  Allergies  Allergen Reactions  . Amoxicillin Diarrhea    yeast infection/ pt doesn't want to take this med.  . Ciprofloxacin     Current Medications: Current Outpatient Medications  Medication Sig Dispense Refill  . cyanocobalamin (,VITAMIN B-12,) 1000 MCG/ML injection INJECT 1 ML (1,000 MCG TOTAL) INTO THE MUSCLE EVERY 30 (THIRTY) DAYS. 3 mL 1   No current facility-administered medications for this visit.     Review of Systems  Constitutional: Negative for diaphoresis, fever, malaise/fatigue and weight loss (weight up 9 pounds).  HENT: Negative.   Eyes: Negative.   Respiratory: Negative for cough, hemoptysis, sputum production and shortness of breath.   Cardiovascular: Negative for chest pain, palpitations, orthopnea, leg swelling and PND.  Gastrointestinal: Negative for abdominal pain, blood in stool, constipation, diarrhea, melena, nausea and vomiting.       Continues on low-FODMAP diet  Genitourinary: Negative for dysuria, frequency, hematuria and urgency.       Menses every 6-8 weeks; normal  Musculoskeletal: Negative for back pain, falls, joint pain and myalgias.  Skin: Negative for itching and rash.  Neurological: Negative for dizziness, tremors, weakness and headaches.  Endo/Heme/Allergies: Does not bruise/bleed easily.  Psychiatric/Behavioral: Negative for  depression, memory loss and suicidal ideas. The patient is not nervous/anxious and does not have insomnia.   All other systems reviewed and are negative.  Physical Exam: Blood pressure (!) 105/58, pulse 74, temperature 97.9 F (36.6 C), temperature source Tympanic, resp. rate 18, weight 128 lb (58.1 kg), SpO2 100 %. GENERAL:  Thin woman sitting comfortably in the exam room in no acute distress. MENTAL STATUS:  Alert and oriented to person, place and time. HEAD:  Short black hair.  Normocephalic, atraumatic, face symmetric, no Cushingoid features. EYES:  Glasses.  Brown eyes.  Pupils equal round and reactive to light and accomodation.  No conjunctivitis or scleral icterus. ENT:  Oropharynx clear without lesion.  Tongue normal. Mucous membranes moist.  RESPIRATORY:  Clear to auscultation without rales, wheezes or rhonchi. CARDIOVASCULAR:  Regular rate and rhythm without murmur, rub or gallop. ABDOMEN:  Soft, non-tender, with active bowel sounds, and no hepatosplenomegaly.  No masses. SKIN:  No rashes, ulcers or lesions. EXTREMITIES: No edema, no skin discoloration or tenderness.  No palpable cords. LYMPH NODES: No palpable cervical, supraclavicular, axillary or inguinal adenopathy  NEUROLOGICAL: Unremarkable. PSYCH:  Appropriate.    Orders Only on 04/10/2018  Component Date Value Ref Range Status  . WBC 04/10/2018 4.1  3.6 - 11.0 K/uL Final  . RBC 04/10/2018 4.15  3.80 - 5.20 MIL/uL Final  . Hemoglobin 04/10/2018 12.9  12.0 - 16.0 g/dL Final  . HCT 16/09/9603 37.3  35.0 - 47.0 % Final  . MCV 04/10/2018 89.8  80.0 - 100.0 fL Final  . MCH 04/10/2018 31.1  26.0 - 34.0 pg Final  . MCHC 04/10/2018 34.7  32.0 - 36.0 g/dL Final  . RDW 54/08/8118 13.4  11.5 - 14.5 % Final  . Platelets 04/10/2018 190  150 - 440 K/uL Final  . Neutrophils Relative % 04/10/2018 58  % Final  . Neutro Abs 04/10/2018 2.4  1.4 - 6.5 K/uL Final  . Lymphocytes Relative 04/10/2018 30  % Final  . Lymphs Abs 04/10/2018 1.2  1.0 - 3.6 K/uL Final  . Monocytes Relative 04/10/2018 7  % Final  . Monocytes Absolute 04/10/2018 0.3  0.2 - 0.9 K/uL Final  . Eosinophils Relative 04/10/2018 4  % Final  . Eosinophils Absolute 04/10/2018 0.1  0 - 0.7 K/uL Final  . Basophils Relative 04/10/2018 1  % Final  . Basophils Absolute 04/10/2018 0.0  0 - 0.1 K/uL Final   Performed at [facility], [address] [place] [place], [place] [address]    Assessment:  Shari Vasquez is a 47 y.o. 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  WBC was 2900 in 09/15/2012.  She has had no infections.  WBC has normalized.  She has B12 deficiency and folic acid deficiency.  She has received monthly B12 for 2 years (last 01/25/2016) then switched to self administration.  She began folic acid on 95/62/1308.  She was taking folic acid 3 times/week, but stopped.  B12 and folate were normal on 09/18/2017  Ferritin has been followed: 7 on 02/29/2016, 15 on 05/28/2016, 15 on 09/03/2016, 38 on 12/05/2016, 24 on 03/14/2017, 31 on 06/12/2017, 33 on 09/18/2017, 23 on 12/19/2017, and 17 on 04/10/2018.  Colonoscopy on 11/12/2016 revealed diverticulosis in the sigmoid and descending colon.  The entire colon was normal.  Random biopsies from the right and left colon revealed no pathologic change.  EGD on 11/12/2016 was normal.  GE junction biopsies revealed changes of  reflux gastroesophagitis.  Biopsies in the stomach revealed minimal chronic gastritis.  Biopsies were negative in the duodenum.  She is on a low-FODMAP diet.  Symptomatically, she denies any complaints.  Vaginal spotting has resolved.  She is eating well. She continues on low-FODMAP diet.  Exam is unremarkable.  Hematocrit is 37.3 with a hemoglobin of 12.9.    Plan: 1.  Labs today:  CBC with diff, ferritin, B12, folate. 2.  Continue B12 monthly at home.   3.  Continue folic acid supplementation based on levels.   4.  Continue iron rich food. 5.  RTC in 3 months for labs (CBC with diff, ferritin, folate). 6.  RTC in 6 months for MD assessment, labs (CBC with diff, ferritin, B12, folate).   Quentin Mulling, NP  04/10/2018,3:45 PM   I saw and evaluated the patient, participating in the key portions of the service and reviewing pertinent diagnostic studies and records.  I reviewed the nurse practitioner's note and agree with the findings and the plan.  The assessment and plan were discussed with the patient.   A few questions were asked by the patient and answered.   Nelva Nay, MD 04/10/2018,3:45 PM

## 2018-04-10 NOTE — Progress Notes (Signed)
Patient states she is not taking any medications except B-12.  She ran out of folate and did not buy another bottle.  States she cannot tell a difference in the way she feels.  States she will see what her values are and can add it back if needed.  Offers no complaints today.

## 2018-04-13 ENCOUNTER — Other Ambulatory Visit: Payer: Self-pay | Admitting: Urgent Care

## 2018-04-13 ENCOUNTER — Telehealth: Payer: Self-pay | Admitting: *Deleted

## 2018-04-13 MED ORDER — CYANOCOBALAMIN 1000 MCG/ML IJ SOLN: 1000.0000 ug | INTRAMUSCULAR | 1 refills | Status: DC

## 2018-04-13 NOTE — Telephone Encounter (Signed)
  Sounds good.  M  

## 2018-04-13 NOTE — Telephone Encounter (Signed)
-----   Message Rosey BathCorcoran, MD sent at 04/12/2018 12:06 PM EDT ----- Regarding: Please call apatient with labs   ----- Message ----- From: Interface, Lab In [place] Sent: 04/10/2018   2:37 PM To: Rosey Bath, MD

## 2018-04-13 NOTE — Telephone Encounter (Signed)
Called patient to inform her that her folate is decreased since last lab draw.  Per Dr. Salena Saner. Will need to take folate but not as much as she was taking.  Also informed her that B-12 prescription was sent in today for her to be able to do her own injections at home.  Also gave her the B-12 levels from her lab draw on Friday.

## 2018-04-13 NOTE — Telephone Encounter (Signed)
Patient called to question the future appointments for injections on her My Chart. She stated that she gives her own B12 injections. She would like the appointments canceled and a prescription sent to her pharmacy.  Please let her know if there is a conflict with this plan.

## 2018-05-08 ENCOUNTER — Ambulatory Visit: Payer: 59

## 2018-05-25 ENCOUNTER — Other Ambulatory Visit: Payer: Self-pay | Admitting: Obstetrics

## 2018-05-25 DIAGNOSIS — Z1231 Encounter for screening mammogram for malignant neoplasm of breast: Secondary | ICD-10-CM

## 2018-06-05 ENCOUNTER — Ambulatory Visit: Payer: 59

## 2018-06-17 ENCOUNTER — Ambulatory Visit
Admission: RE | Admit: 2018-06-17 | Discharge: 2018-06-17 | Disposition: A | Discharge status: Home / Self Care | Payer: 59 | Source: Ambulatory Visit | Attending: Obstetrics | Admitting: Obstetrics

## 2018-06-17 DIAGNOSIS — Z1231 Encounter for screening mammogram for malignant neoplasm of breast: Secondary | ICD-10-CM

## 2018-07-03 ENCOUNTER — Ambulatory Visit: Payer: 59

## 2018-07-10 ENCOUNTER — Telehealth: Payer: Self-pay | Admitting: *Deleted

## 2018-07-10 ENCOUNTER — Other Ambulatory Visit: Payer: Self-pay | Admitting: *Deleted

## 2018-07-10 ENCOUNTER — Inpatient Hospital Stay: Payer: 59 | Attending: Hematology and Oncology

## 2018-07-10 DIAGNOSIS — Z79899 Other long term (current) drug therapy: Secondary | ICD-10-CM | POA: Insufficient documentation

## 2018-07-10 DIAGNOSIS — E538 Deficiency of other specified B group vitamins: Secondary | ICD-10-CM | POA: Insufficient documentation

## 2018-07-10 DIAGNOSIS — D72819 Decreased white blood cell count, unspecified: Secondary | ICD-10-CM | POA: Insufficient documentation

## 2018-07-10 DIAGNOSIS — D509 Iron deficiency anemia, unspecified: Secondary | ICD-10-CM

## 2018-07-10 LAB — CBC WITH DIFFERENTIAL/PLATELET
Basophils Absolute: 0 10*3/uL (ref 0–0.1)
Basophils Relative: 1 %
Eosinophils Absolute: 0.1 10*3/uL (ref 0–0.7)
Eosinophils Relative: 3 %
HCT: 35.6 % (ref 35.0–47.0)
Hemoglobin: 12.1 g/dL (ref 12.0–16.0)
Lymphocytes Relative: 34 %
Lymphs Abs: 1.2 10*3/uL (ref 1.0–3.6)
MCH: 30.9 pg (ref 26.0–34.0)
MCHC: 34 g/dL (ref 32.0–36.0)
MCV: 91 fL (ref 80.0–100.0)
Monocytes Absolute: 0.3 10*3/uL (ref 0.2–0.9)
Monocytes Relative: 8 %
Neutro Abs: 2 10*3/uL (ref 1.4–6.5)
Neutrophils Relative %: 54 %
Platelets: 184 10*3/uL (ref 150–440)
RBC: 3.91 MIL/uL (ref 3.80–5.20)
RDW: 13.2 % (ref 11.5–14.5)
WBC: 3.7 10*3/uL (ref 3.6–11.0)

## 2018-07-10 LAB — FOLATE: Folate: 5.8 ng/mL — ABNORMAL LOW (ref >5.9)

## 2018-07-10 LAB — FERRITIN: Ferritin: 19 ng/mL (ref 11–307)

## 2018-07-10 NOTE — Telephone Encounter (Signed)
Entered in error

## 2018-07-10 NOTE — Telephone Encounter (Signed)
Called patient to inquire if she is taking folate.  Patient states she stopped when levels improved.  Advised her per MD to restart @ 2 mg daily.  Will recheck in one month.  Patent verbalized understanding.

## 2018-08-06 ENCOUNTER — Ambulatory Visit: Payer: 59

## 2018-08-13 ENCOUNTER — Inpatient Hospital Stay: Payer: 59

## 2018-08-20 ENCOUNTER — Inpatient Hospital Stay: Payer: 59 | Attending: Hematology and Oncology

## 2018-08-20 DIAGNOSIS — E538 Deficiency of other specified B group vitamins: Secondary | ICD-10-CM | POA: Insufficient documentation

## 2018-08-20 LAB — FOLATE: Folate: 33 ng/mL (ref >5.9)

## 2018-09-03 ENCOUNTER — Ambulatory Visit: Payer: 59

## 2018-10-12 ENCOUNTER — Other Ambulatory Visit: Payer: 59

## 2018-10-12 ENCOUNTER — Ambulatory Visit: Payer: 59 | Admitting: Hematology and Oncology

## 2018-10-15 ENCOUNTER — Inpatient Hospital Stay: Payer: 59 | Attending: Hematology and Oncology

## 2018-10-15 ENCOUNTER — Other Ambulatory Visit: Payer: Self-pay | Admitting: Hematology and Oncology

## 2018-10-15 ENCOUNTER — Encounter: Payer: Self-pay | Admitting: Hematology and Oncology

## 2018-10-15 ENCOUNTER — Inpatient Hospital Stay (HOSPITAL_BASED_OUTPATIENT_CLINIC_OR_DEPARTMENT_OTHER): Payer: 59 | Admitting: Hematology and Oncology

## 2018-10-15 VITALS — BP 100/69 | HR 96 | Temp 98.0°F | Resp 16 | Wt 127.6 lb

## 2018-10-15 DIAGNOSIS — Z79899 Other long term (current) drug therapy: Secondary | ICD-10-CM

## 2018-10-15 DIAGNOSIS — E538 Deficiency of other specified B group vitamins: Secondary | ICD-10-CM | POA: Insufficient documentation

## 2018-10-15 DIAGNOSIS — E611 Iron deficiency: Secondary | ICD-10-CM | POA: Insufficient documentation

## 2018-10-15 DIAGNOSIS — D509 Iron deficiency anemia, unspecified: Secondary | ICD-10-CM

## 2018-10-15 LAB — CBC WITH DIFFERENTIAL/PLATELET
Abs Immature Granulocytes: 0.01 10*3/uL (ref 0.00–0.07)
Basophils Absolute: 0 10*3/uL (ref 0.0–0.1)
Basophils Relative: 1 %
Eosinophils Absolute: 0.1 10*3/uL (ref 0.0–0.5)
Eosinophils Relative: 2 %
HCT: 37.7 % (ref 36.0–46.0)
Hemoglobin: 12.9 g/dL (ref 12.0–15.0)
Immature Granulocytes: 0 %
Lymphocytes Relative: 36 %
Lymphs Abs: 1.5 10*3/uL (ref 0.7–4.0)
MCH: 31 pg (ref 26.0–34.0)
MCHC: 34.2 g/dL (ref 30.0–36.0)
MCV: 90.6 fL (ref 80.0–100.0)
Monocytes Absolute: 0.4 10*3/uL (ref 0.1–1.0)
Monocytes Relative: 10 %
Neutro Abs: 2.2 10*3/uL (ref 1.7–7.7)
Neutrophils Relative %: 51 %
Platelets: 190 10*3/uL (ref 150–400)
RBC: 4.16 MIL/uL (ref 3.87–5.11)
RDW: 12.6 % (ref 11.5–15.5)
WBC: 4.3 10*3/uL (ref 4.0–10.5)
nRBC: 0 % (ref 0.0–0.2)

## 2018-10-15 LAB — COMPREHENSIVE METABOLIC PANEL
ALT: 14 U/L (ref 0–44)
AST: 22 U/L (ref 15–41)
Albumin: 4.5 g/dL (ref 3.5–5.0)
Alkaline Phosphatase: 37 U/L — ABNORMAL LOW (ref 38–126)
Anion gap: 6 (ref 5–15)
BUN: 17 mg/dL (ref 6–20)
CO2: 29 mmol/L (ref 22–32)
Calcium: 8.9 mg/dL (ref 8.9–10.3)
Chloride: 103 mmol/L (ref 98–111)
Creatinine, Ser: 0.94 mg/dL (ref 0.44–1.00)
GFR calc Af Amer: >60 mL/min (ref >60)
GFR calc non Af Amer: >60 mL/min (ref >60)
Glucose, Bld: 97 mg/dL (ref 70–99)
Potassium: 4.4 mmol/L (ref 3.5–5.1)
Sodium: 138 mmol/L (ref 135–145)
Total Bilirubin: 0.6 mg/dL (ref 0.3–1.2)
Total Protein: 7.7 g/dL (ref 6.5–8.1)

## 2018-10-15 LAB — FOLATE: Folate: 41 ng/mL (ref >5.9)

## 2018-10-15 LAB — FERRITIN: Ferritin: 33 ng/mL (ref 11–307)

## 2018-10-15 LAB — VITAMIN B12: Vitamin B-12: 418 pg/mL (ref 180–914)

## 2018-10-15 NOTE — Progress Notes (Signed)
Pt in for 6 month follow up, denies any concerns today. 

## 2018-10-15 NOTE — Progress Notes (Signed)
[facility] day:  10/15/18   Chief Complaint: Shari Vasquez is a 47 y.o. female with R00 and folic acid deficiency who is seen for 6 month assessment.  HPI:  The patient was last seen in the hematology clinic on 04/10/2018.  At that time, she denied any complaints.  Vaginal spotting had resolved.  She was eating well. She continued on low-FODMAP diet.  Exam was unremarkable.  Hematocrit was 37.3 with a hemoglobin of 12.9.  Ferritin was 17.  B12 was 340.  Folate was 7.9.  She continued monthly B12 injections at home (last Monday of the month).   Labs on 07/10/2018 revealed a hematocrit of 35.6, hemoglobin 12.1, MCV 91, platelets 184,000, WBC 3700 with an ANC of 2000.  Ferritin was 19.  Folate was 5.8.  She was contacted regarding her low folate.  She was not taking.  She restarted folate at 400-800 mcg/day.  Folate was 33.0 on 08/20/2018.  Symptomatically, patient is doing well. She denies any acute concerns today. She feels generally well. Patient denies that she has experienced any B symptoms. She denies any interval infections. Patient denies bleeding; no hematochezia, melena, or gross hematuria.  Patient continues on a low-FODMAP diet.   Patient has been taking Ferralet (ferrous gluconate 90 mg, folic acid 1 mg, T62 12 mcg, vitamin C 120 mg, and docusate 50 mg) daily since September as prescribed by her GYN provider. Patient is inconsistently having menstrual cycles at this time. She states, "I am getting to that funny age".   Patient advises that she maintains an adequate appetite. She is eating well. Weight today is 127 lb 9 oz (57.9 kg), which compared to her last visit to the clinic, represents a  1 pound decrease.   Patient denies pain in the clinic today.   Past Medical History:  Diagnosis Date  . Anemia   . Gluten intolerance   . Vitamin D deficiency     Past Surgical History:  Procedure Laterality Date  . COLONOSCOPY WITH PROPOFOL  N/A 11/12/2016   Procedure: COLONOSCOPY WITH PROPOFOL;  Surgeon: Lollie Sails, MD;  Location[facility] ENDOSCOPY;  Service: Endoscopy;  Laterality: N/A;  . ESOPHAGOGASTRODUODENOSCOPY (EGD) WITH PROPOFOL N/A 11/12/2016   Procedure: ESOPHAGOGASTRODUODENOSCOPY (EGD) WITH PROPOFOL;  Surgeon: Lollie Sails, MD;  Location: [facility] ENDOSCOPY;  Service: Endoscopy;  Laterality: N/A;  . TUBAL LIGATION  06/12/2001    Family History  Problem Relation Age of Onset  . Cancer Mother        thyroid  . Cancer Paternal Grandfather 47       Lung  . Cancer Other        Breast (Great Aunt)    Social History:  reports that she has never smoked. She has never used smokeless tobacco. She reports that she does not drink alcohol or use drugs.  She lives in [place]. The patient is alone today.  Allergies:  Allergies  Allergen Reactions  . Amoxicillin Diarrhea    yeast infection/ pt doesn't want to take this med.  . Ciprofloxacin     Current Medications: Current Outpatient Medications  Medication Sig Dispense Refill  . cyanocobalamin (,VITAMIN B-12,) 1000 MCG/ML injection Inject 1 mL (1,000 mcg total) into the muscle every 30 (thirty) days. 3 mL 1  . Fe Cbn-Fe Gluc-FA-B12-C-DSS (FERRALET 90) 90-1 MG TABS Take 1 tablet by mouth daily.  3  . folic acid (FOLVITE) 263 MCG tablet Take by mouth.  No current facility-administered medications for this visit.     Review of Systems  Constitutional: Positive for weight loss (1 pound). Negative for chills, diaphoresis and malaise/fatigue.       Feels well.  HENT: Negative.   Eyes: Negative.   Respiratory: Negative.  Negative for cough, hemoptysis, sputum production and shortness of breath.   Cardiovascular: Negative.  Negative for chest pain, palpitations, orthopnea, leg swelling and PND.  Gastrointestinal: Negative.  Negative for abdominal pain, blood in stool, constipation, diarrhea, melena, nausea and vomiting.       Continues on low-FODMAP diet   Genitourinary: Negative.  Negative for dysuria, frequency, hematuria and urgency.       Menses every 6-8 weeks; normal  Musculoskeletal: Negative.  Negative for back pain, falls, joint pain, myalgias and neck pain.  Skin: Negative for itching and rash.  Neurological: Negative.  Negative for dizziness, tremors, speech change, focal weakness, weakness and headaches.  Endo/Heme/Allergies: Negative.  Does not bruise/bleed easily.  Psychiatric/Behavioral: Negative for depression, memory loss and suicidal ideas. The patient is not nervous/anxious and does not have insomnia.   All other systems reviewed and are negative.  Physical Exam: Blood pressure 100/69, pulse 96, temperature 98 F (36.7 C), temperature source Tympanic, resp. rate 16, weight 127 lb 9 oz (57.9 kg), SpO2 100 %. GENERAL:  Thin woman sitting comfortably in the exam room in no acute distress. MENTAL STATUS:  Alert and oriented to person, place and time. HEAD:  Short black hair.  Normocephalic, atraumatic, face symmetric, no Cushingoid features. EYES:  Glasses.  Brown eyes.  Pupils equal round and reactive to light and accomodation.  No conjunctivitis or scleral icterus. ENT:  Oropharynx clear without lesion.  Tongue normal. Mucous membranes moist.  RESPIRATORY:  Clear to auscultation without rales, wheezes or rhonchi. CARDIOVASCULAR:  Regular rate and rhythm without murmur, rub or gallop. ABDOMEN:  Soft, non-tender, with active bowel sounds, and no hepatosplenomegaly.  No masses. SKIN:  No rashes, ulcers or lesions. EXTREMITIES: No edema, no skin discoloration or tenderness.  No palpable cords. LYMPH NODES: No palpable cervical, supraclavicular, axillary or inguinal adenopathy  NEUROLOGICAL: Unremarkable. PSYCH:  Appropriate.    Appointment on 10/15/2018  Component Date Value Ref Range Status  . WBC 10/15/2018 4.3  4.0 - 10.5 K/uL Final  . RBC 10/15/2018 4.16  3.87 - 5.11 MIL/uL Final  . Hemoglobin 10/15/2018 12.9  12.0 -  15.0 g/dL Final  . HCT 10/15/2018 37.7  36.0 - 46.0 % Final  . MCV 10/15/2018 90.6  80.0 - 100.0 fL Final  . MCH 10/15/2018 31.0  26.0 - 34.0 pg Final  . MCHC 10/15/2018 34.2  30.0 - 36.0 g/dL Final  . RDW 10/15/2018 12.6  11.5 - 15.5 % Final  . Platelets 10/15/2018 190  150 - 400 K/uL Final  . nRBC 10/15/2018 0.0  0.0 - 0.2 % Final  . Neutrophils Relative % 10/15/2018 51  % Final  . Neutro Abs 10/15/2018 2.2  1.7 - 7.7 K/uL Final  . Lymphocytes Relative 10/15/2018 36  % Final  . Lymphs Abs 10/15/2018 1.5  0.7 - 4.0 K/uL Final  . Monocytes Relative 10/15/2018 10  % Final  . Monocytes Absolute 10/15/2018 0.4  0.1 - 1.0 K/uL Final  . Eosinophils Relative 10/15/2018 2  % Final  . Eosinophils Absolute 10/15/2018 0.1  0.0 - 0.5 K/uL Final  . Basophils Relative 10/15/2018 1  % Final  . Basophils Absolute 10/15/2018 0.0  0.0 - 0.1 K/uL Final  .  Immature Granulocytes 10/15/2018 0  % Final  . Abs Immature Granulocytes 10/15/2018 0.01  0.00 - 0.07 K/uL Final   Performed at [facility], [address], [place], [place] [address]  . Sodium 10/15/2018 138  135 - 145 mmol/L Final  . Potassium 10/15/2018 4.4  3.5 - 5.1 mmol/L Final  . Chloride 10/15/2018 103  98 - 111 mmol/L Final  . CO2 10/15/2018 29  22 - 32 mmol/L Final  . Glucose, Bld 10/15/2018 97  70 - 99 mg/dL Final  . BUN 10/15/2018 17  6 - 20 mg/dL Final  . Creatinine, Ser 10/15/2018 0.94  0.44 - 1.00 mg/dL Final  . Calcium 10/15/2018 8.9  8.9 - 10.3 mg/dL Final  . Total Protein 10/15/2018 7.7  6.5 - 8.1 g/dL Final  . Albumin 10/15/2018 4.5  3.5 - 5.0 g/dL Final  . AST 10/15/2018 22  15 - 41 U/L Final  . ALT 10/15/2018 14  0 - 44 U/L Final  . Alkaline Phosphatase 10/15/2018 37* 38 - 126 U/L Final  . Total Bilirubin 10/15/2018 0.6  0.3 - 1.2 mg/dL Final  . GFR calc non Af Amer 10/15/2018 >60  >60 mL/min Final  . GFR calc Af Amer 10/15/2018 >60  >60 mL/min Final   Comment: (NOTE) The eGFR has been calculated using the CKD EPI  equation. This calculation has not been validated in all clinical situations. eGFR's persistently <60 mL/min signify possible Chronic Kidney Disease.   Georgiann Hahn gap 10/15/2018 6  5 - 15 Final   Performed at [facility], [address] [place], [place] [address]    Assessment:  Shari Vasquez is a 47 y.o. 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  WBC was 2900 in 09/15/2012.  She has had no infections.  WBC has normalized.  She has B12 deficiency and folic acid deficiency.  She has received monthly B12 for 2 years (last 01/25/2016) then switched to self administration.  She began folic acid on 81/85/6314.  She was taking folic acid 3 times/week, but stopped.  B12 and folate were normal on 09/18/2017  B12 has been followed: 401 on 02/29/2016, 708 on 09/03/2016, 439 on 03/14/2017, 503 on 09/18/2017, 340 on 04/10/2018. Folate has been followed: 4.4 on 08/03/2015, 25 on 09/26/2015, 26 on 09/03/2016, 17.9 on 03/14/2017, 546. On 09/18/2017, 7.9 on 04/10/2018, 5.8 on 07/10/2018, and 33.0 on 08/20/2018.  Ferritin has been followed: 7 on 02/29/2016, 15 on 05/28/2016, 15 on 09/03/2016, 38 on 12/05/2016, 24 on 03/14/2017, 31 on 06/12/2017, 33 on 09/18/2017, 23 on 12/19/2017, 17 on 04/10/2018, 19 on 07/10/2018, and 33 on 10/15/2018.  Colonoscopy on 11/12/2016 revealed diverticulosis in the sigmoid and descending colon.  The entire colon was normal.  Random biopsies from the right and left colon revealed no pathologic change.  EGD on 11/12/2016 was normal.  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  Biopsies in the stomach revealed minimal chronic gastritis.  Biopsies were negative in the duodenum.  She is on a low-FODMAP diet.  Symptomatically, she is doing well.  She continues on low-FODMAP diet.  Exam is normal.  Hematocrit is 37.7 with a hemoglobin of 12.9.    Plan: 1.  Labs today:  CBC with diff, CMP, ferritin,  B12, folate. 2.  B12 deficiency:  Receives B12 monthly at home  3.  Folate deficiency:  Interval low folate after discontinuation of folate.  Folate was 33.0 on 08/20/2018. 4.  Low iron levels:  Hemoglobin 12.9.  Ferritin is 33.  Patient eating iron rich foods.  Patient taking Ferralet (ferrous gluconate 90 mg, folic acid 1 mg, R30 12 mcg, vitamin C 120 mg, and docusate 50 mg) daily. 5.  Leukopenia:  Resolved 12/19/2017. 6.  RTC in 3 months for labs (CBC with diff, ferritin, folate). 7.  RTC in 6 months for MD assessment and labs (CBC with diff, ferritin, B12, folate).   Honor Loh, NP  10/15/2018,2:28 PM   I saw and evaluated the patient, participating in the key portions of the service and reviewing pertinent diagnostic studies and records.  I reviewed the nurse practitioner's note and agree with the findings and the plan.  The assessment and plan were discussed with the patient.  Several questions were asked by the patient and answered.   Nolon Stalls, MD 10/15/2018,2:28 PM

## 2018-10-16 ENCOUNTER — Telehealth: Payer: Self-pay | Admitting: *Deleted

## 2018-10-16 NOTE — Telephone Encounter (Signed)
-----   Message from Verlee MonteBryan E Gray, NP sent at 10/15/2018  6:01 PM EST ----- Folate now high. Let's cut back on the folate. She does not need more than 1 mg (1000 mcg) a day.   Patient is on QuimbyFerralet. As long as she remains on that, she should not need any additional folic acid.   Iron is improving.  Judie GrieveBryan

## 2018-10-16 NOTE — Telephone Encounter (Signed)
Called patient to inform her that her folate is a little high.  Advised her to reduce dose to 1000 mcg daily.  Verbalized understanding.

## 2018-10-16 NOTE — Telephone Encounter (Signed)
Called patient and LVM that B-12 levels are good.  Will continue to monitor periodically.

## 2018-10-16 NOTE — Telephone Encounter (Signed)
-----   Message from Rosey BathMelissa C Corcoran, MD sent at 10/16/2018  7:01 AM EST ----- Regarding: Please call patient  B12 level currently looks good.  We will continue to follow periodically.  M  ----- Message ----- From: Leory PlowmanInterface, [facility] [place] Sent: 10/15/2018   2:06 PM EST To: Rosey BathMelissa C Corcoran, MD

## 2018-12-16 ENCOUNTER — Encounter: Payer: Self-pay | Admitting: Family Medicine

## 2019-01-14 ENCOUNTER — Inpatient Hospital Stay: Payer: 59 | Attending: Hematology and Oncology

## 2019-02-23 ENCOUNTER — Telehealth: Payer: Self-pay | Admitting: Family Medicine

## 2019-02-23 NOTE — Telephone Encounter (Signed)
Tried to call to reschedule for 3 months out because of covid-19. Had to leave a message.

## 2019-02-25 ENCOUNTER — Encounter: Payer: 59 | Admitting: Family Medicine

## 2019-03-15 ENCOUNTER — Telehealth: Payer: Self-pay | Admitting: Family Medicine

## 2019-03-15 NOTE — Telephone Encounter (Signed)
Tried to call to reschedule CPE twice and left messages with no call back

## 2019-03-17 ENCOUNTER — Other Ambulatory Visit: Payer: Self-pay | Admitting: *Deleted

## 2019-03-17 MED ORDER — CYANOCOBALAMIN 1000 MCG/ML IJ SOLN: 1000.0000 ug | INTRAMUSCULAR | 1 refills | Status: DC

## 2019-04-08 ENCOUNTER — Encounter: Payer: 59 | Admitting: Family Medicine

## 2019-04-15 ENCOUNTER — Other Ambulatory Visit: Payer: 59

## 2019-04-15 ENCOUNTER — Ambulatory Visit: Payer: 59 | Admitting: Hematology and Oncology

## 2019-06-09 ENCOUNTER — Encounter: Payer: 59 | Admitting: Family Medicine

## 2019-08-22 ENCOUNTER — Encounter: Payer: Self-pay | Admitting: Family Medicine

## 2019-08-31 ENCOUNTER — Other Ambulatory Visit: Payer: Self-pay | Admitting: Obstetrics

## 2019-08-31 DIAGNOSIS — Z1231 Encounter for screening mammogram for malignant neoplasm of breast: Secondary | ICD-10-CM

## 2019-09-01 ENCOUNTER — Other Ambulatory Visit: Payer: Self-pay | Admitting: Hematology and Oncology

## 2019-09-09 ENCOUNTER — Encounter: Payer: 59 | Admitting: Family Medicine

## 2019-09-28 ENCOUNTER — Telehealth: Payer: Self-pay

## 2019-09-28 DIAGNOSIS — D559 Anemia due to enzyme disorder, unspecified: Secondary | ICD-10-CM | POA: Insufficient documentation

## 2019-09-28 NOTE — Telephone Encounter (Signed)
Travel or Contacts:   Any travel in the past 2 weeks? NO  Have you came in contact with anyone who has Covid? YES, PT WORKS AT HOSPITAL.  UTILIZES PPE  Have you had a positive Covid test? NO If so, when?  Fever >100.69F []   Yes [x]   No []   Unknown  Chills []   Yes [x]   No []   Unknown  Muscle aches (myalgia) []   Yes [x]   No []   Unknown  Runny nose (rhinorrhea) []   Yes [x]   No []   Unknown  Sore throat []   Yes [x]   No []   Unknown Cough (new onset or worsening of chronic cough) []   Yes [x]   No []   Unknown  Shortness of breath (dyspnea) []   Yes [x]   No []   Unknown Nausea or vomiting []   Yes [x]   No []   Unknown  Headache []   Yes [x]   No []   Unknown  Abdominal pain  []   Yes [x]   No []   Unknown  Diarrhea (?3 loose/looser than normal stools/24hr period) []   Yes [x]   No []   Unknown Other, s:pecify

## 2019-09-28 NOTE — Progress Notes (Signed)
Subjective:   Patient ID: Shari Vasquez, female    DOB: 12/23/1970, 48 y.o.   MRN: [id number]  Shari Vasquez is a pleasant 48 y.o. year old female who presents to clinic today with Annual Exam (Pt is here today for a CPE without PAP. Her GYN is Noland FordyceKelly Fogleman and her Mammogram is scheduled for 11.13.20.  Immunizations are UTD.)  on 09/29/2019  HPI:   Health Maintenance  Topic Date Due  . HIV Screening  01/10/1987  . PAP SMEAR-Modifier  05/09/2014  . INFLUENZA VACCINE  07/03/2019  . TETANUS/TDAP  05/13/2021   Has GYN- Dr. Ernestina PennaFogleman. Mammogram scheduled for 10/15/19.  B12 deficiency, iron deficiency anemia- was followed by Dr. Merlene Pullingorcoran, hematology. Last saw her on 10/15/18.  Note reviewed.   Lab Results  Component Value Date   WBC 4.3 10/15/2018   HGB 12.9 10/15/2018   HCT 37.7 10/15/2018   MCV 90.6 10/15/2018   PLT 190 10/15/2018   . Lab Results  Component Value Date   FERRITIN 33 10/15/2018   Lab Results  Component Value Date   VITAMINB12 418 10/15/2018   Lab Results  Component Value Date   VD25OH 30 02/03/2013   VD25OH 26 (L) 11/16/2012   VD25OH 25 (L) 09/15/2012    Lab Results  Component Value Date   CHOL 123 10/31/2014   HDL 33.10 (L) 10/31/2014   LDLCALC 74 10/31/2014   TRIG 82.0 10/31/2014   CHOLHDL 4 10/31/2014    Current Outpatient Medications on File Prior to Visit  Medication Sig Dispense Refill  . cyanocobalamin (,VITAMIN B-12,) 1000 MCG/ML injection INJECT 1 ML (1,000 MCG TOTAL) INTO THE MUSCLE EVERY 30 (THIRTY) DAYS. 3 mL 1  . estradiol (ESTRACE) 0.5 MG tablet Take 0.5 mg by mouth daily.    Marland Kitchen. Fe Cbn-Fe Gluc-FA-B12-C-DSS (FERRALET 90) 90-1 MG TABS Take 1 tablet by mouth daily.  3  . folic acid (FOLVITE) 400 MCG tablet Take by mouth.    . levonorgestrel (MIRENA) 20 MCG/24HR IUD 1 each by Intrauterine route once.     No current facility-administered medications on file prior to visit.     Allergies  Allergen Reactions  . Influenza  Vaccines Anaphylaxis  . Amoxicillin Diarrhea    yeast infection/ pt doesn't want to take this med.  . Ciprofloxacin     Past Medical History:  Diagnosis Date  . Anemia   . Gluten intolerance   . Vitamin D deficiency     Past Surgical History:  Procedure Laterality Date  . COLONOSCOPY WITH PROPOFOL N/A 11/12/2016   Procedure: COLONOSCOPY WITH PROPOFOL;  Surgeon: Christena DeemMartin U Skulskie, MD;  [facility];  Service: Endoscopy;  Laterality: N/A;  . ESOPHAGOGASTRODUODENOSCOPY (EGD) WITH PROPOFOL N/A 11/12/2016   Procedure: ESOPHAGOGASTRODUODENOSCOPY (EGD) WITH PROPOFOL;  Surgeon: Christena DeemMartin U Skulskie, MD;  [facility];  Service: Endoscopy;  Laterality: N/A;  . TUBAL LIGATION  06/12/2001    Family History  Problem Relation Age of Onset  . Cancer Mother        thyroid  . Cancer Paternal Grandfather [id number]       Lung  . Cancer Other        Breast (Great Aunt)    Social History   Socioeconomic History  . Marital status: Married    Spouse name: Not on file  . Number of children: 2  . Years of education: Not on file  . Highest education level: Not on file  Occupational History  . Occupation: LandChiropractor  Social Needs  . Financial resource strain: Not on file  . Food insecurity    Worry: Not on file    Inability: Not on file  . Transportation needs    Medical: Not on file    Non-medical: Not on file  Tobacco Use  . Smoking status: Never Smoker  . Smokeless tobacco: Never Used  Substance and Sexual Activity  . Alcohol use: No  . Drug use: No  . Sexual activity: Yes  Lifestyle  . Physical activity    Days per week: Not on file    Minutes per session: Not on file  . Stress: Not on file  Relationships  . Social Musician on phone: Not on file    Gets together: Not on file    Attends religious service: Not on file    Active member of club or organization: Not on file    Attends meetings of clubs or organizations: Not on file    Relationship  status: Not on file  . Intimate partner violence    Fear of current or ex partner: Not on file    Emotionally abused: Not on file    Physically abused: Not on file    Forced sexual activity: Not on file  Other Topics Concern  . Not on file  Social History Narrative   Religion affecting care-no immunizations   Regular exercise   The PMH, PSH, Social History, Family History, Medications, and allergies have been reviewed in [facility], and have been updated if relevant.  Review of Systems  Constitutional: Negative.   HENT: Negative.   Eyes: Negative.   Respiratory: Negative.   Cardiovascular: Negative.   Gastrointestinal: Negative.   Endocrine: Negative.   Genitourinary: Negative.   Musculoskeletal: Negative.   Allergic/Immunologic: Negative.   Neurological: Negative.   Hematological: Negative.   Psychiatric/Behavioral: Negative.   All other systems reviewed and are negative.      Objective:    Pulse 73   Temp 98.1 F (36.7 C) (Oral)   Ht 5\' 6"  (1.676 m)   Wt 137 lb 6.4 oz (62.3 kg)   LMP 09/16/2019   SpO2 99%   BMI 22.18 kg/m    Physical Exam   General:  Well-developed,well-nourished,in no acute distress; alert,appropriate and cooperative throughout examination Head:  normocephalic and atraumatic.   Eyes:  vision grossly intact, PERRL Ears:  R ear normal and L ear normal externally, TMs clear bilaterally Nose:  no external deformity.   Mouth:  good dentition.   Neck:  No deformities, masses, or tenderness noted. Breasts:  No mass, nodules, thickening, tenderness, bulging, retraction, inflamation, nipple discharge or skin changes noted.   Lungs:  Normal respiratory effort, chest expands symmetrically. Lungs are clear to auscultation, no crackles or wheezes. Heart:  Normal rate and regular rhythm. S1 and S2 normal without gallop, murmur, click, rub or other extra sounds. Abdomen:  Bowel sounds positive,abdomen soft and non-tender without masses, organomegaly or hernias  noted. Msk:  No deformity or scoliosis noted of thoracic or lumbar spine.   Extremities:  No clubbing, cyanosis, edema, or deformity noted with normal full range of motion of all joints.   Neurologic:  alert & oriented X3 and gait normal.   Skin:  Intact without suspicious lesions or rashes Cervical Nodes:  No lymphadenopathy noted Axillary Nodes:  No palpable lymphadenopathy Psych:  Cognition and judgment appear intact. Alert and cooperative with normal attention span and concentration. 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

## 2019-09-29 ENCOUNTER — Encounter: Payer: Self-pay | Admitting: Family Medicine

## 2019-09-29 ENCOUNTER — Other Ambulatory Visit: Payer: Self-pay

## 2019-09-29 ENCOUNTER — Ambulatory Visit (INDEPENDENT_AMBULATORY_CARE_PROVIDER_SITE_OTHER): Payer: 59 | Admitting: Family Medicine

## 2019-09-29 VITALS — HR 73 | Temp 98.1°F | Ht 66.0 in | Wt 137.4 lb

## 2019-09-29 DIAGNOSIS — D513 Other dietary vitamin B12 deficiency anemia: Secondary | ICD-10-CM

## 2019-09-29 DIAGNOSIS — D509 Iron deficiency anemia, unspecified: Secondary | ICD-10-CM

## 2019-09-29 DIAGNOSIS — D559 Anemia due to enzyme disorder, unspecified: Secondary | ICD-10-CM

## 2019-09-29 DIAGNOSIS — E0590 Thyrotoxicosis, unspecified without thyrotoxic crisis or storm: Secondary | ICD-10-CM

## 2019-09-29 DIAGNOSIS — Z0000 Encounter for general adult medical examination without abnormal findings: Secondary | ICD-10-CM | POA: Diagnosis not present

## 2019-09-29 DIAGNOSIS — E559 Vitamin D deficiency, unspecified: Secondary | ICD-10-CM

## 2019-09-29 DIAGNOSIS — E538 Deficiency of other specified B group vitamins: Secondary | ICD-10-CM

## 2019-09-29 LAB — COMPREHENSIVE METABOLIC PANEL
ALT: 10 U/L (ref 0–35)
AST: 18 U/L (ref 0–37)
Albumin: 4.3 g/dL (ref 3.5–5.2)
Alkaline Phosphatase: 40 U/L (ref 39–117)
BUN: 14 mg/dL (ref 6–23)
CO2: 31 mEq/L (ref 19–32)
Calcium: 9.1 mg/dL (ref 8.4–10.5)
Chloride: 103 mEq/L (ref 96–112)
Creatinine, Ser: 0.9 mg/dL (ref 0.40–1.20)
GFR: 66.91 mL/min (ref >60.00)
Glucose, Bld: 92 mg/dL (ref 70–99)
Potassium: 4 mEq/L (ref 3.5–5.1)
Sodium: 139 mEq/L (ref 135–145)
Total Bilirubin: 0.7 mg/dL (ref 0.2–1.2)
Total Protein: 7 g/dL (ref 6.0–8.3)

## 2019-09-29 LAB — LIPID PANEL
Cholesterol: 143 mg/dL (ref 0–200)
HDL: 45.2 mg/dL (ref >39.00)
LDL Cholesterol: 89 mg/dL (ref 0–99)
NonHDL: 97.64
Total CHOL/HDL Ratio: 3
Triglycerides: 44 mg/dL (ref 0.0–149.0)
VLDL: 8.8 mg/dL (ref 0.0–40.0)

## 2019-09-29 LAB — CBC WITH DIFFERENTIAL/PLATELET
Basophils Absolute: 0 10*3/uL (ref 0.0–0.1)
Basophils Relative: 1.1 % (ref 0.0–3.0)
Eosinophils Absolute: 0.1 10*3/uL (ref 0.0–0.7)
Eosinophils Relative: 2.5 % (ref 0.0–5.0)
HCT: 38.7 % (ref 36.0–46.0)
Hemoglobin: 13.1 g/dL (ref 12.0–15.0)
Lymphocytes Relative: 34.5 % (ref 12.0–46.0)
Lymphs Abs: 1.2 10*3/uL (ref 0.7–4.0)
MCHC: 33.8 g/dL (ref 30.0–36.0)
MCV: 93.2 fl (ref 78.0–100.0)
Monocytes Absolute: 0.3 10*3/uL (ref 0.1–1.0)
Monocytes Relative: 8 % (ref 3.0–12.0)
Neutro Abs: 1.8 10*3/uL (ref 1.4–7.7)
Neutrophils Relative %: 53.9 % (ref 43.0–77.0)
Platelets: 198 10*3/uL (ref 150.0–400.0)
RBC: 4.16 Mil/uL (ref 3.87–5.11)
RDW: 12.4 % (ref 11.5–15.5)
WBC: 3.3 10*3/uL — ABNORMAL LOW (ref 4.0–10.5)

## 2019-09-29 NOTE — Patient Instructions (Addendum)
Great to see you. I will call you with your lab results from today and you can view them online.   Say hi to your family for me.

## 2019-09-29 NOTE — Assessment & Plan Note (Signed)
-   Check vit D today

## 2019-09-29 NOTE — Assessment & Plan Note (Signed)
Check labs today.  Orders Placed This Encounter  Procedures  . CBC with Differential/Platelet  . Ferritin  . B12  . Vitamin D (25 hydroxy)  . TSH  . T4, free  . Lipid panel  . Comprehensive metabolic panel  . B12 and Folate Panel

## 2019-09-29 NOTE — Assessment & Plan Note (Signed)
Reviewed preventive care protocols, scheduled due services, and updated immunizations Discussed nutrition, exercise, diet, and healthy lifestyle.  

## 2019-09-30 LAB — VITAMIN D 25 HYDROXY (VIT D DEFICIENCY, FRACTURES): VITD: 17.73 ng/mL — ABNORMAL LOW (ref 30.00–100.00)

## 2019-09-30 LAB — VITAMIN B12: Vitamin B-12: 350 pg/mL (ref 211–911)

## 2019-09-30 LAB — B12 AND FOLATE PANEL
Folate: 15.2 ng/mL (ref >5.9)
Vitamin B-12: 350 pg/mL (ref 211–911)

## 2019-09-30 LAB — T4, FREE: Free T4: 0.82 ng/dL (ref 0.60–1.60)

## 2019-09-30 LAB — TSH: TSH: 0.93 u[IU]/mL (ref 0.35–4.50)

## 2019-09-30 LAB — FERRITIN: Ferritin: 18 ng/mL (ref 10.0–291.0)

## 2019-10-01 MED ORDER — CHOLECALCIFEROL 1.25 MG (50000 UT) PO TABS: ORAL_TABLET | ORAL | 0 refills | Status: DC

## 2019-10-01 NOTE — Addendum Note (Signed)
Addended by: Darral Dash on: 10/01/2019 01:53 PM   Modules accepted: Orders

## 2019-10-15 ENCOUNTER — Ambulatory Visit
Admission: RE | Admit: 2019-10-15 | Discharge: 2019-10-15 | Disposition: A | Discharge status: Home / Self Care | Payer: 59 | Source: Ambulatory Visit | Attending: Obstetrics | Admitting: Obstetrics

## 2019-10-15 ENCOUNTER — Other Ambulatory Visit: Payer: Self-pay

## 2019-10-15 DIAGNOSIS — Z1231 Encounter for screening mammogram for malignant neoplasm of breast: Secondary | ICD-10-CM

## 2019-12-19 ENCOUNTER — Other Ambulatory Visit: Payer: Self-pay | Admitting: Family Medicine

## 2019-12-21 ENCOUNTER — Encounter: Payer: Self-pay | Admitting: Family Medicine

## 2019-12-23 ENCOUNTER — Other Ambulatory Visit: Payer: Self-pay

## 2019-12-23 ENCOUNTER — Telehealth: Payer: Self-pay | Admitting: Family Medicine

## 2019-12-23 DIAGNOSIS — E559 Vitamin D deficiency, unspecified: Secondary | ICD-10-CM

## 2019-12-23 NOTE — Telephone Encounter (Signed)
Patient calling to schedule appointment for lab (vitamin D). Patient has no lab orders, please call patient once lab orders have been put in.

## 2019-12-23 NOTE — Progress Notes (Signed)
Created future order for Vit-Laporche Martelle per TA/thx dmf

## 2019-12-23 NOTE — Telephone Encounter (Signed)
MW-Orders have been entered per Ta/may schedule at convenience/thx dmf

## 2019-12-27 NOTE — Telephone Encounter (Signed)
I called and spoke to patient and scheduled lab appointment with patient.

## 2019-12-28 ENCOUNTER — Other Ambulatory Visit: Payer: Self-pay | Admitting: Hematology and Oncology

## 2019-12-29 ENCOUNTER — Other Ambulatory Visit: Payer: Self-pay

## 2019-12-30 ENCOUNTER — Other Ambulatory Visit (INDEPENDENT_AMBULATORY_CARE_PROVIDER_SITE_OTHER): Payer: No Typology Code available for payment source

## 2019-12-30 DIAGNOSIS — E559 Vitamin D deficiency, unspecified: Secondary | ICD-10-CM | POA: Diagnosis not present

## 2019-12-30 LAB — VITAMIN D 25 HYDROXY (VIT D DEFICIENCY, FRACTURES): VITD: 37.9 ng/mL (ref 30.00–100.00)

## 2020-01-04 ENCOUNTER — Other Ambulatory Visit: Payer: Self-pay

## 2020-01-04 MED ORDER — VITAMIN D (ERGOCALCIFEROL) 1.25 MG (50000 UNIT) PO CAPS: ORAL_CAPSULE | ORAL | 0 refills | Status: DC

## 2020-04-06 ENCOUNTER — Telehealth: Payer: Self-pay | Admitting: Family Medicine

## 2020-04-06 NOTE — Telephone Encounter (Signed)
Vaccine for what we have on file printed off for patient who states that she will pick up.

## 2020-04-06 NOTE — Telephone Encounter (Signed)
Patient needs a copy of her vaccination record. She was formerly a patient of Dr. Dayton Martes. Please call when ready to pick up.

## 2020-08-05 ENCOUNTER — Other Ambulatory Visit: Payer: Self-pay | Admitting: Hematology and Oncology

## 2020-10-01 ENCOUNTER — Emergency Department: Payer: PRIVATE HEALTH INSURANCE

## 2020-10-01 ENCOUNTER — Other Ambulatory Visit: Payer: Self-pay

## 2020-10-01 ENCOUNTER — Emergency Department
Admission: EM | Admit: 2020-10-01 | Discharge: 2020-10-01 | Disposition: A | Discharge status: Home / Self Care | Payer: PRIVATE HEALTH INSURANCE | Attending: Emergency Medicine | Admitting: Emergency Medicine

## 2020-10-01 ENCOUNTER — Encounter: Payer: Self-pay | Admitting: Radiology

## 2020-10-01 DIAGNOSIS — R2241 Localized swelling, mass and lump, right lower limb: Secondary | ICD-10-CM | POA: Diagnosis not present

## 2020-10-01 DIAGNOSIS — S90111A Contusion of right great toe without damage to nail, initial encounter: Secondary | ICD-10-CM | POA: Diagnosis not present

## 2020-10-01 DIAGNOSIS — S99921A Unspecified injury of right foot, initial encounter: Secondary | ICD-10-CM | POA: Diagnosis present

## 2020-10-01 DIAGNOSIS — W208XXA Other cause of strike by thrown, projected or falling object, initial encounter: Secondary | ICD-10-CM | POA: Diagnosis not present

## 2020-10-01 MED ORDER — IBUPROFEN 600 MG PO TABS
600.0000 mg | ORAL_TABLET | Freq: Once | ORAL | Status: AC
  Administered 2020-10-01: 600 mg via ORAL

## 2020-10-01 NOTE — ED Triage Notes (Signed)
Patient reports dropped oxygen tank on right great toe

## 2020-10-01 NOTE — ED Provider Notes (Signed)
[facility] Emergency Department Provider Note  ____________________________________________   First MD Initiated Contact with Patient 10/01/20 0104     (approximate)  I have reviewed the triage vital signs and the nursing notes.   HISTORY  Chief Complaint Toe Pain    HPI Shari Vasquez is a 49 y.o. female who is here for evaluation of pain to her right great toe.  She was working in her job as a Architectural technologist at [employer] when an oxygen tank rolled off of a bed and onto her toe.  She had immediate onset of moderate pain and there is some swelling and bruising.  She is able to ambulate although it hurts to do so.  No numbness or tingling.  No discoloration underneath the nail.  No other injuries noted.         Past Medical History:  Diagnosis Date  . Anemia   . Gluten intolerance   . Vitamin D deficiency     Patient Active Problem List   Diagnosis Date Noted  . Well woman exam without gynecological exam 09/29/2019  . 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 Iron deficiency anemia 08/31/2016  . Unintentional weight loss 06/26/2016  . Low iron stores 02/29/2016  . B12 deficiency 08/22/2015  . Folate deficiency 08/22/2015  . Encounter for completion of form with patient 06/21/2015  . Vitamin D deficiency 07/27/2010  . Anemia 07/27/2010  . Hyperthyroidism 07/27/2010    Past Surgical History:  Procedure Laterality Date  . COLONOSCOPY WITH PROPOFOL N/A 11/12/2016   Procedure: COLONOSCOPY WITH PROPOFOL;  Surgeon: Christena Deem, MD;  Location: [facility] ENDOSCOPY;  Service: Endoscopy;  Laterality: N/A;  . ESOPHAGOGASTRODUODENOSCOPY (EGD) WITH PROPOFOL N/A 11/12/2016   Procedure: ESOPHAGOGASTRODUODENOSCOPY (EGD) WITH PROPOFOL;  Surgeon: Christena Deem, MD;  Location: [facility] ENDOSCOPY;  Service: Endoscopy;  Laterality: N/A;  . TUBAL LIGATION  06/12/2001    Prior to Admission medications   Medication Sig Start Date End Date  Taking? Authorizing Provider  Cholecalciferol 1.25 MG (50000 UT) TABS 50,000 units PO qwk for 12 weeks. 10/01/19   Dianne Dun, MD  cyanocobalamin (,VITAMIN B-12,) 1000 MCG/ML injection INJECT 1 ML (1,000 MCG TOTAL) INTO THE MUSCLE EVERY 30 (THIRTY) DAYS. 08/05/20   Rosey Bath, MD  estradiol (ESTRACE) 0.5 MG tablet Take 0.5 mg by mouth daily. 09/26/19   [provider]  Fe Cbn-Fe Gluc-FA-B12-C-DSS (FERRALET 90) 90-1 MG TABS Take 1 tablet by mouth daily. 08/19/18   [provider]  folic acid (FOLVITE) 400 MCG tablet Take by mouth.    [provider]  levonorgestrel (MIRENA) 20 MCG/24HR IUD 1 each by Intrauterine route once.    [provider]  Vitamin D, Ergocalciferol, (DRISDOL) 1.25 MG (50000 UNIT) CAPS capsule TAKE 1 CAPSULES BY MOUTH EVERY WEEKS FOR 12 WEEKS 01/04/20   Dianne Dun, MD    Allergies Influenza vaccines, Amoxicillin, and Ciprofloxacin  Family History  Problem Relation Age of Onset  . Cancer Mother        thyroid  . Cancer Paternal Grandfather 51       Lung  . Cancer Other        Breast [employer] Aunt)    Social History Social History   Tobacco Use  . Smoking status: Never Smoker  . Smokeless tobacco: Never Used  Vaping Use  . Vaping Use: Never used  Substance Use Topics  . Alcohol use: No  . Drug use: No  Review of Systems Constitutional: No fever/chills Cardiovascular: Denies chest pain. Respiratory: Denies shortness of breath. Gastrointestinal: No abdominal pain.   Musculoskeletal: Pain in right great toe. Integumentary: Bruising to right great toe. Neurological: Negative for headaches, focal weakness or numbness.   ____________________________________________   PHYSICAL EXAM:  VITAL SIGNS: ED Triage Vitals  Enc Vitals Group     BP 10/01/20 0024 (!) 136/91     Pulse Rate 10/01/20 0024 85     Resp 10/01/20 0024 16     Temp 10/01/20 0024 98.2 F (36.8 C)     Temp Source 10/01/20 0024 Oral     SpO2  10/01/20 0024 100 %     Weight 10/01/20 0009 61.2 kg (135 lb)     Height 10/01/20 0009 1.664 m (5' 5.5")     Head Circumference --      Peak Flow --      Pain Score 10/01/20 0009 8     Pain Loc --      Pain Edu? --      Excl. in GC? --     Constitutional: Alert and oriented.  Eyes: Conjunctivae are normal.  Head: Atraumatic. Cardiovascular: Normal rate, regular rhythm. Good peripheral circulation. Respiratory: Normal respiratory effort.  No retractions. Musculoskeletal: There is a small amount of swelling as well as some ecchymosis to the right great toe.  The nail is spared and there is no subungual hematoma.  There is tenderness to palpation and with flexion extension of the toe.  No midfoot tenderness or tenderness of the MTP nor proximally on the foot.  No ankle injury. Neurologic:  Normal speech and language. No gross focal neurologic deficits are appreciated.  Skin:  Skin is warm, dry and intact. Psychiatric: Mood and affect are normal. Speech and behavior are normal.  ____________________________________________   LABS (all labs ordered are listed, but only abnormal results are displayed)  Labs Reviewed - No data to display ____________________________________________  EKG  Indication for emergent EKG ____________________________________________  RADIOLOGY I, Loleta Rose, personally viewed and evaluated these images (plain radiographs) as part of my medical decision making, as well as reviewing the written report by the radiologist.  ED MD interpretation: No indication of fracture or dislocation.  Official radiology report(s): DG Toe Great Right  Result Date: 10/01/2020 CLINICAL DATA:  Status post trauma. EXAM: RIGHT GREAT TOE COMPARISON:  None. FINDINGS: There is no evidence of fracture or dislocation. There is no evidence of arthropathy or other focal bone abnormality. Soft tissues are unremarkable. IMPRESSION: Negative. Electronically Signed   By: Aram Candela M.D.   On: 10/01/2020 00:41    ____________________________________________   PROCEDURES   Procedure(s) performed (including Critical Care):  Procedures   ____________________________________________   INITIAL IMPRESSION / MDM / ASSESSMENT AND PLAN / ED COURSE  As part of my medical decision making, I reviewed the following data within the electronic MEDICAL RECORD NUMBER Nursing notes reviewed and incorporated and Radiograph reviewed    I personally reviewed the patient's imaging and agree with the radiologist's interpretation that there is no evidence of acute fracture.  There is also no subungual hematoma.  The patient will manage the contusion conservatively and declines a hard soled shoe and crutches at this time.  She is given information for podiatry follow-up if she would like to do so.  She prefers to continue working (this is a workers comp case ) and I am giving her a return-to-work note to continue working tonight.  ____________________________________________  FINAL CLINICAL IMPRESSION(S) / ED DIAGNOSES  Final diagnoses:  Contusion of right great toe without damage to nail, initial encounter     MEDICATIONS GIVEN DURING THIS VISIT:  Medications  ibuprofen (ADVIL) tablet 600 mg (has no administration in time range)     ED Discharge Orders    None      *Please note:  EBELIN DILLEHAY was evaluated in Emergency Department on 10/01/2020 for the symptoms described in the history of present illness. She was evaluated in the context of the global COVID-19 pandemic, which necessitated consideration that the patient might be at risk for infection with the SARS-CoV-2 virus that causes COVID-19. Institutional protocols and algorithms that pertain to the evaluation of patients at risk for COVID-19 are in a state of rapid change based on information released by regulatory bodies including the CDC and federal and state organizations. These policies and  algorithms were followed during the patient's care in the ED.  Some ED evaluations and interventions may be delayed as a result of limited staffing during and after the pandemic.*  Note:  This document was prepared using Dragon voice recognition software and may include unintentional dictation errors.   Loleta Rose, MD 10/01/20 [phone]

## 2020-10-01 NOTE — ED Notes (Signed)
Pt with bruising to the rt great toe, minimal swelling. Pt prompted to elevate foot on stool and provided with ice pack.

## 2020-10-01 NOTE — ED Notes (Signed)
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

## 2020-10-05 ENCOUNTER — Encounter: Payer: No Typology Code available for payment source | Admitting: Family Medicine

## 2020-11-28 ENCOUNTER — Encounter: Payer: No Typology Code available for payment source | Admitting: Family Medicine

## 2020-12-26 ENCOUNTER — Ambulatory Visit: Payer: No Typology Code available for payment source | Admitting: Family Medicine

## 2021-05-02 DIAGNOSIS — U071 COVID-19: Secondary | ICD-10-CM

## 2021-05-02 HISTORY — DX: COVID-19: U07.1

## 2021-05-30 ENCOUNTER — Ambulatory Visit: Admit: 2021-05-30 | Payer: No Typology Code available for payment source

## 2021-05-31 ENCOUNTER — Ambulatory Visit (INDEPENDENT_AMBULATORY_CARE_PROVIDER_SITE_OTHER): Payer: No Typology Code available for payment source

## 2021-05-31 ENCOUNTER — Other Ambulatory Visit: Payer: Self-pay

## 2021-05-31 ENCOUNTER — Ambulatory Visit
Admission: RE | Admit: 2021-05-31 | Discharge: 2021-05-31 | Disposition: A | Discharge status: Home / Self Care | Payer: No Typology Code available for payment source | Source: Ambulatory Visit | Attending: Emergency Medicine | Admitting: Emergency Medicine

## 2021-05-31 VITALS — BP 114/82 | HR 75 | Temp 98.1°F | Resp 18

## 2021-05-31 DIAGNOSIS — U071 COVID-19: Secondary | ICD-10-CM

## 2021-05-31 DIAGNOSIS — R0602 Shortness of breath: Secondary | ICD-10-CM

## 2021-05-31 DIAGNOSIS — R059 Cough, unspecified: Secondary | ICD-10-CM

## 2021-05-31 NOTE — Discharge Instructions (Addendum)
Your chest xray does not show any acute abnormality.    Follow-up with [facility] [employer] to discuss quarantine or return to work.    Follow up with your primary care provider if your symptoms are not improving.

## 2021-05-31 NOTE — ED Triage Notes (Signed)
Pt presents today with c/o of SOB, cough, sinus pressure x 6 days. Tested Positive for Covid 05/26/21. She is requesting CXR before returning to work.

## 2021-05-31 NOTE — ED Provider Notes (Signed)
Renaldo Fiddler    CSN: [id number] Arrival date & time: 05/31/21  0806      History   Chief Complaint Chief Complaint  Patient presents with   Facial Pain   Sore Throat   Shortness of Breath   Cough     HPI Shari Vasquez is a 50 y.o. female.  Patient is COVID positive 05/25/2021; tested through [facility].  She presents with sore throat, congestion, cough, and shortness of breath; Symptoms onset 05/25/2021.  She denies fever, chills, vomiting, diarrhea, or other symptoms.  She requests chest xray.  OTC treatments attempted at home, Mucinex.  Her medical history includes anemia and vitamin D deficiency.  The history is provided by the patient and medical records.   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   Iron deficiency anemia 08/31/2016   Unintentional weight loss 06/26/2016   Low iron stores 02/29/2016   B12 deficiency 08/22/2015   Folate deficiency 08/22/2015   Encounter for completion of form with patient 06/21/2015   Vitamin D deficiency 07/27/2010   Anemia 07/27/2010   Hyperthyroidism 07/27/2010    Past Surgical History:  Procedure Laterality Date   COLONOSCOPY WITH PROPOFOL N/A 11/12/2016   Procedure: COLONOSCOPY WITH PROPOFOL;  Surgeon: Christena Deem, MD;  Location: [facility] ENDOSCOPY;  Service: Endoscopy;  Laterality: N/A;   ESOPHAGOGASTRODUODENOSCOPY (EGD) WITH PROPOFOL N/A 11/12/2016   Procedure: ESOPHAGOGASTRODUODENOSCOPY (EGD) WITH PROPOFOL;  Surgeon: Christena Deem, MD;  Location: [facility] ENDOSCOPY;  Service: Endoscopy;  Laterality: N/A;   TUBAL LIGATION  06/12/2001    OB History   No obstetric history on file.      Home Medications    Prior to Admission medications   Medication Sig  Start Date End Date Taking? Authorizing Provider  Cholecalciferol 1.25 MG (50000 UT) TABS 50,000 units PO qwk for 12 weeks. 10/01/19   Dianne Dun, MD  cyanocobalamin (,VITAMIN B-12,) 1000 MCG/ML injection INJECT 1 ML (1,000 MCG TOTAL) INTO THE MUSCLE EVERY 30 (THIRTY) DAYS. 08/05/20   Rosey Bath, MD  estradiol (ESTRACE) 0.5 MG tablet Take 0.5 mg by mouth daily. 09/26/19   [provider]  Fe Cbn-Fe Gluc-FA-B12-C-DSS (FERRALET 90) 90-1 MG TABS Take 1 tablet by mouth daily. 08/19/18   [provider]  folic acid (FOLVITE) 400 MCG tablet Take by mouth.    [provider]  levonorgestrel (MIRENA) 20 MCG/24HR IUD 1 each by Intrauterine route once.    [provider]  Vitamin D, Ergocalciferol, (DRISDOL) 1.25 MG (50000 UNIT) CAPS capsule TAKE 1 CAPSULES BY MOUTH EVERY WEEKS FOR 12 WEEKS 01/04/20   Dianne Dun, MD    Family History Family History  Problem Relation Age of Onset   Cancer Mother        thyroid   Cancer Paternal Grandfather 73       Lung   Cancer Other        Breast (Probation officer)    Social History Social History   Tobacco Use   Smoking status: Never   Smokeless tobacco: Never  Vaping Use   Vaping Use: Never used  Substance Use Topics   Alcohol use: No   Drug use: No  Allergies   Influenza vaccines, Amoxicillin, Ciprofloxacin, and Stevia glycerite extract [flavoring agent]   Review of Systems Review of Systems  Constitutional:  Negative for chills and fever.  HENT:  Positive for congestion and sore throat. Negative for ear pain.   Respiratory:  Positive for cough and shortness of breath.   Cardiovascular:  Negative for chest pain and palpitations.  Gastrointestinal:  Negative for abdominal pain, diarrhea and vomiting.  Skin:  Negative for color change and rash.  All other systems reviewed and are negative.   Physical Exam Triage Vital Signs ED Triage Vitals  Enc Vitals Group     BP      Pulse      Resp       Temp      Temp src      SpO2      Weight      Height      Head Circumference      Peak Flow      Pain Score      Pain Loc      Pain Edu?      Excl. in GC?    No data found.  Updated Vital Signs BP 114/82 (BP Location: Right Arm)   Pulse 75   Temp 98.1 F (36.7 C) (Oral)   Resp 18   SpO2 98%   Visual Acuity Right Eye Distance:   Left Eye Distance:   Bilateral Distance:    Right Eye Near:   Left Eye Near:    Bilateral Near:     Physical Exam Vitals and nursing note reviewed.  Constitutional:      General: She is not in acute distress.    Appearance: She is well-developed. She is not ill-appearing.  HENT:     Head: Normocephalic and atraumatic.     Right Ear: Tympanic membrane normal.     Left Ear: Tympanic membrane normal.     Nose: Nose normal.     Mouth/Throat:     Mouth: Mucous membranes are moist.     Pharynx: Oropharynx is clear.  Eyes:     Conjunctiva/sclera: Conjunctivae normal.  Cardiovascular:     Rate and Rhythm: Normal rate and regular rhythm.     Heart sounds: Normal heart sounds.  Pulmonary:     Effort: Pulmonary effort is normal. No respiratory distress.     Breath sounds: Normal breath sounds.  Abdominal:     Palpations: Abdomen is soft.     Tenderness: There is no abdominal tenderness.  Musculoskeletal:     Cervical back: Neck supple.  Skin:    General: Skin is warm and dry.  Neurological:     General: No focal deficit present.     Mental Status: She is alert and oriented to person, place, and time.  Psychiatric:        Mood and Affect: Mood normal.        Behavior: Behavior normal.     UC Treatments / Results  Labs (all labs ordered are listed, but only abnormal results are displayed) Labs Reviewed - No data to display  EKG   Radiology DG Chest 2 View  Result Date: 05/31/2021 CLINICAL DATA:  Cough and shortness of breath, COVID-19 positivity EXAM: CHEST - 2 VIEW COMPARISON:  None. FINDINGS: Cardiac shadow is within normal  limits. The lungs are well aerated bilaterally. No focal confluent infiltrate is seen. No sizable effusion is noted. No bony abnormality is seen. Biapical pleural and parenchymal scarring is seen.  IMPRESSION: No acute abnormality noted. Electronically Signed   By: Alcide Clever M.D.   On: 05/31/2021 08:40    Procedures Procedures (including critical care time)  Medications Ordered in UC Medications - No data to display  Initial Impression / Assessment and Plan / UC Course  I have reviewed the triage vital signs and the nursing notes.  Pertinent labs & imaging results that were available during my care of the patient were reviewed by me and considered in my medical decision making (see chart for details).  COVID-19, cough, shortness of breath.  Patient is well-appearing and her exam is reassuring.  O2 sat 98%.  Chest x-ray shows no acute abnormality.  Instructed her to follow-up with [facility] to discuss return to work date.  Instructed her to follow-up with her PCP if her symptoms are not improving.  She agrees to plan of care.   Final Clinical Impressions(s) / UC Diagnoses   Final diagnoses:  COVID-19  Cough  SOB (shortness of breath)     Discharge Instructions      Your chest xray does not show any acute abnormality.    Follow-up with [facility] [employer] to discuss quarantine or return to work.    Follow up with your primary care provider if your symptoms are not improving.        ED Prescriptions   None    PDMP not reviewed this encounter.   Mickie Bail, NP 05/31/21 [phone]

## 2021-07-17 ENCOUNTER — Other Ambulatory Visit: Payer: Self-pay | Admitting: Obstetrics

## 2021-07-17 DIAGNOSIS — Z1231 Encounter for screening mammogram for malignant neoplasm of breast: Secondary | ICD-10-CM

## 2021-07-26 ENCOUNTER — Encounter: Payer: Self-pay | Admitting: Family Medicine

## 2021-07-26 ENCOUNTER — Ambulatory Visit (INDEPENDENT_AMBULATORY_CARE_PROVIDER_SITE_OTHER): Payer: No Typology Code available for payment source | Admitting: Family Medicine

## 2021-07-26 ENCOUNTER — Encounter: Payer: Self-pay | Admitting: Hematology and Oncology

## 2021-07-26 ENCOUNTER — Other Ambulatory Visit: Payer: Self-pay

## 2021-07-26 VITALS — BP 102/68 | HR 81 | Temp 97.0°F | Ht 65.0 in | Wt 137.6 lb

## 2021-07-26 DIAGNOSIS — Z0000 Encounter for general adult medical examination without abnormal findings: Secondary | ICD-10-CM

## 2021-07-26 DIAGNOSIS — E559 Vitamin D deficiency, unspecified: Secondary | ICD-10-CM

## 2021-07-26 NOTE — Progress Notes (Addendum)
Established Patient Office Visit  Subjective:  Patient ID: Shari Vasquez, female    DOB: 09-Feb-1971  Age: 50 y.o. MRN: [id number]  CC:  Chief Complaint  Patient presents with   Transitions Of Care    TOC from Dr. Dayton Martes, no concerns. Patient not fasting.     HPI Shari Vasquez presents for for physical exam.  She is doing quite well.  She is training for a half Ironman.  She lives at home with her husband and daughter who just finished college but is working remotely.  Son lives in school in [place].  Female care is through GYN.  Mammogram is scheduled soon.  History of irritable bowel syndrome with the prominence of diarrhea.  She is allergic to casein and is sensitive to gluten.  She is followed by GI for these issues.  Upcoming colonoscopy to be scheduled through GI.  Past Medical History:  Diagnosis Date   Anemia    COVID-19 05/2021   Gluten intolerance    Vitamin D deficiency     Past Surgical History:  Procedure Laterality Date   COLONOSCOPY WITH PROPOFOL N/A 11/12/2016   Procedure: COLONOSCOPY WITH PROPOFOL;  Surgeon: Christena Deem, MD;  Location: [facility] ENDOSCOPY;  Service: Endoscopy;  Laterality: N/A;   ESOPHAGOGASTRODUODENOSCOPY (EGD) WITH PROPOFOL N/A 11/12/2016   Procedure: ESOPHAGOGASTRODUODENOSCOPY (EGD) WITH PROPOFOL;  Surgeon: Christena Deem, MD;  Location: [facility] ENDOSCOPY;  Service: Endoscopy;  Laterality: N/A;   TUBAL LIGATION  06/12/2001    Family History  Problem Relation Age of Onset   Cancer Mother        thyroid   Cancer Paternal Grandfather 29       Lung   Cancer Other        Breast Conservation officer, nature)    Social History   Socioeconomic History   Marital status: Married    Spouse name: Not on file   Number of children: 2   Years of education: Not on file   Highest education level: Not on file  Occupational History   Occupation: Land  Tobacco Use   Smoking status: Never   Smokeless tobacco: Never  Vaping Use   Vaping Use: Never  used  Substance and Sexual Activity   Alcohol use: No   Drug use: No   Sexual activity: Yes  Other Topics Concern   Not on file  Social History Narrative   Religion affecting care-no immunizations   Regular exercise   Social Determinants of Health   Financial Resource Strain: Not on file  Food Insecurity: Not on file  Transportation Needs: Not on file  Physical Activity: Not on file  Stress: Not on file  Social Connections: Not on file  Intimate Partner Violence: Not on file    Outpatient Medications Prior to Visit  Medication Sig Dispense Refill   Cholecalciferol (VITAMIN D) 50 MCG (2000 UT) tablet Take 2,000 Units by mouth daily.     levonorgestrel (MIRENA) 20 MCG/24HR IUD 1 each by Intrauterine route once.     Melatonin 5 MG CAPS Take by mouth.     methyl salicylate liquid Apply 1 application topically as needed for muscle pain.     Multiple Vitamin (MULTI-VITAMIN DAILY PO) Take by mouth.     Nutritional Supplements (DHEA PO) Take by mouth.     omega-3 acid ethyl esters (LOVAZA) 1 g capsule Take by mouth 2 (two) times daily.     Vitamin A 4.5 MG (15000 UT) TABS Take by mouth.  Cholecalciferol 1.25 MG (50000 UT) TABS 50,000 units PO qwk for 12 weeks. 12 tablet 0   cyanocobalamin (,VITAMIN B-12,) 1000 MCG/ML injection INJECT 1 ML (1,000 MCG TOTAL) INTO THE MUSCLE EVERY 30 (THIRTY) DAYS. 3 mL 1   estradiol (ESTRACE) 0.5 MG tablet Take 0.5 mg by mouth daily.     Fe Cbn-Fe Gluc-FA-B12-C-DSS (FERRALET 90) 90-1 MG TABS Take 1 tablet by mouth daily.  3   folic acid (FOLVITE) 400 MCG tablet Take by mouth.     Vitamin D, Ergocalciferol, (DRISDOL) 1.25 MG (50000 UNIT) CAPS capsule TAKE 1 CAPSULES BY MOUTH EVERY WEEKS FOR 12 WEEKS 12 capsule 0   No facility-administered medications prior to visit.    Allergies  Allergen Reactions   Influenza Vac Split Quad Anaphylaxis   Influenza Vaccines Anaphylaxis   Amoxicillin Diarrhea    yeast infection/ pt doesn't want to take this med.    Ciprofloxacin    Stevia Glycerite Extract [Flavoring Agent] Itching    ROS Review of Systems  Constitutional: Negative.   HENT: Negative.    Eyes:  Negative for photophobia and visual disturbance.  Respiratory: Negative.    Gastrointestinal:  Positive for diarrhea.  Endocrine: Negative for polyphagia and polyuria.  Genitourinary: Negative.   Musculoskeletal:  Negative for gait problem and joint swelling.  Neurological:  Negative for speech difficulty and weakness.  Depression screen [facility] 2/9 07/26/2021 07/26/2021 09/29/2019  Decreased Interest 0 0 0  Down, Depressed, Hopeless 0 0 0  PHQ - 2 Score 0 0 0  Altered sleeping 0 - 0  Tired, decreased energy 0 - 1  Change in appetite 0 - 0  Feeling bad or failure about yourself  0 - 0  Trouble concentrating 0 - 0  Moving slowly or fidgety/restless 0 - 0  Suicidal thoughts 0 - 0  PHQ-9 Score 0 - 1  Difficult doing work/chores Not difficult at all - Not difficult at all       Objective:    Physical Exam Vitals and nursing note reviewed.  Constitutional:      General: She is not in acute distress.    Appearance: Normal appearance. She is normal weight. She is not ill-appearing, toxic-appearing or diaphoretic.  HENT:     Head: Normocephalic and atraumatic.     Right Ear: Tympanic membrane, ear canal and external ear normal.     Left Ear: Tympanic membrane, ear canal and external ear normal.     Mouth/Throat:     Mouth: Mucous membranes are moist.     Pharynx: Oropharynx is clear. No oropharyngeal exudate or posterior oropharyngeal erythema.  Eyes:     General: No scleral icterus.       Right eye: No discharge.        Left eye: No discharge.     Extraocular Movements: Extraocular movements intact.     Conjunctiva/sclera: Conjunctivae normal.     Pupils: Pupils are equal, round, and reactive to light.  Neck:     Vascular: No carotid bruit.  Cardiovascular:     Rate and Rhythm: Normal rate and regular rhythm.  Pulmonary:      Effort: Pulmonary effort is normal.     Breath sounds: Normal breath sounds.  Abdominal:     General: Bowel sounds are normal.  Musculoskeletal:        General: Normal range of motion.     Cervical back: No rigidity or tenderness.  Lymphadenopathy:     Cervical: No cervical adenopathy.  Skin:  General: Skin is warm and dry.  Neurological:     Mental Status: She is alert and oriented to person, place, and time.     Motor: No weakness.    BP 102/68 (BP Location: Left Arm, Patient Position: Sitting, Cuff Size: Normal)   Pulse 81   Temp (!) 97 F (36.1 C) (Temporal)   Ht 5\' 5"  (1.651 m)   Wt 137 lb 9.6 oz (62.4 kg)   SpO2 99%   BMI 22.90 kg/m  Wt Readings from Last 3 Encounters:  07/26/21 137 lb 9.6 oz (62.4 kg)  10/01/20 135 lb (61.2 kg)  09/29/19 137 lb 6.4 oz (62.3 kg)     Health Maintenance Due  Topic Date Due   HIV Screening  Never done   Hepatitis C Screening  Never done   PAP SMEAR-Modifier  05/09/2014   TETANUS/TDAP  05/13/2021    There are no preventive care reminders to display for this patient.  Lab Results  Component Value Date   TSH 0.93 09/29/2019   Lab Results  Component Value Date   WBC 3.3 (L) 09/29/2019   HGB 13.1 09/29/2019   HCT 38.7 09/29/2019   MCV 93.2 09/29/2019   PLT 198.0 09/29/2019   Lab Results  Component Value Date   NA 139 09/29/2019   K 4.0 09/29/2019   CO2 31 09/29/2019   GLUCOSE 92 09/29/2019   BUN 14 09/29/2019   CREATININE 0.90 09/29/2019   BILITOT 0.7 09/29/2019   ALKPHOS 40 09/29/2019   AST 18 09/29/2019   ALT 10 09/29/2019   PROT 7.0 09/29/2019   ALBUMIN 4.3 09/29/2019   CALCIUM 9.1 09/29/2019   ANIONGAP 6 10/15/2018   GFR 66.91 09/29/2019   Lab Results  Component Value Date   CHOL 143 09/29/2019   Lab Results  Component Value Date   HDL 45.20 09/29/2019   Lab Results  Component Value Date   LDLCALC 89 09/29/2019   Lab Results  Component Value Date   TRIG 44.0 09/29/2019   Lab Results   Component Value Date   CHOLHDL 3 09/29/2019   No results found for: HGBA1C    Assessment & Plan:   Problem List Items Addressed This Visit       Other   Vitamin D deficiency   Relevant Orders   VITAMIN D 25 Hydroxy (Vit-D Deficiency, Fractures)   Other Visit Diagnoses     Healthcare maintenance    -  Primary   Relevant Orders   CBC   Comprehensive metabolic panel   Lipid panel   Urinalysis, Routine w reflex microscopic       No orders of the defined types were placed in this encounter.   Follow-up: Return in about 1 year (around 07/26/2022), or Return fasting for above ordered blood work..  Given information on health maintenance and disease prevention.  We will maintain follow-up with GYN provider irritable bowel well woman care and GI for irritable bowel and colonoscopy.  We both are expecting normal lab work.  Encouraged her to maintain her healthy lifestyle and positive attitude.  Information was given on health maintenance and disease prevention.   07/28/2022, MD

## 2021-07-31 ENCOUNTER — Other Ambulatory Visit (INDEPENDENT_AMBULATORY_CARE_PROVIDER_SITE_OTHER): Payer: No Typology Code available for payment source

## 2021-07-31 ENCOUNTER — Other Ambulatory Visit: Payer: Self-pay

## 2021-07-31 DIAGNOSIS — E559 Vitamin D deficiency, unspecified: Secondary | ICD-10-CM

## 2021-07-31 DIAGNOSIS — Z0000 Encounter for general adult medical examination without abnormal findings: Secondary | ICD-10-CM

## 2021-07-31 LAB — COMPREHENSIVE METABOLIC PANEL
ALT: 12 U/L (ref 0–35)
AST: 20 U/L (ref 0–37)
Albumin: 4.1 g/dL (ref 3.5–5.2)
Alkaline Phosphatase: 48 U/L (ref 39–117)
BUN: 11 mg/dL (ref 6–23)
CO2: 29 mEq/L (ref 19–32)
Calcium: 9.3 mg/dL (ref 8.4–10.5)
Chloride: 103 mEq/L (ref 96–112)
Creatinine, Ser: 0.92 mg/dL (ref 0.40–1.20)
GFR: 73.09 mL/min (ref >60.00)
Glucose, Bld: 83 mg/dL (ref 70–99)
Potassium: 3.7 mEq/L (ref 3.5–5.1)
Sodium: 139 mEq/L (ref 135–145)
Total Bilirubin: 0.8 mg/dL (ref 0.2–1.2)
Total Protein: 7.1 g/dL (ref 6.0–8.3)

## 2021-07-31 LAB — URINALYSIS, ROUTINE W REFLEX MICROSCOPIC
Bilirubin Urine: NEGATIVE
Hgb urine dipstick: NEGATIVE
Ketones, ur: NEGATIVE
Nitrite: NEGATIVE
Specific Gravity, Urine: >=1.03 — AB (ref 1.000–1.030)
Total Protein, Urine: NEGATIVE
Urine Glucose: NEGATIVE
Urobilinogen, UA: 0.2 (ref 0.0–1.0)
pH: 6 (ref 5.0–8.0)

## 2021-07-31 LAB — CBC
HCT: 38.1 % (ref 36.0–46.0)
Hemoglobin: 12.9 g/dL (ref 12.0–15.0)
MCHC: 33.8 g/dL (ref 30.0–36.0)
MCV: 93.8 fl (ref 78.0–100.0)
Platelets: 187 10*3/uL (ref 150.0–400.0)
RBC: 4.06 Mil/uL (ref 3.87–5.11)
RDW: 14.3 % (ref 11.5–15.5)
WBC: 3.5 10*3/uL — ABNORMAL LOW (ref 4.0–10.5)

## 2021-07-31 LAB — LIPID PANEL
Cholesterol: 141 mg/dL (ref 0–200)
HDL: 48.2 mg/dL (ref >39.00)
LDL Cholesterol: 79 mg/dL (ref 0–99)
NonHDL: 92.85
Total CHOL/HDL Ratio: 3
Triglycerides: 69 mg/dL (ref 0.0–149.0)
VLDL: 13.8 mg/dL (ref 0.0–40.0)

## 2021-07-31 LAB — VITAMIN D 25 HYDROXY (VIT D DEFICIENCY, FRACTURES): VITD: 58.18 ng/mL (ref 30.00–100.00)

## 2021-07-31 NOTE — Progress Notes (Signed)
Per the orders of Dr.Kremer pt is here for labs pt tolerated draw well. Pt was able to leave sufficient amount of urine for, sample requested.

## 2021-08-02 ENCOUNTER — Ambulatory Visit
Admission: RE | Admit: 2021-08-02 | Discharge: 2021-08-02 | Disposition: A | Discharge status: Home / Self Care | Payer: No Typology Code available for payment source | Source: Ambulatory Visit | Attending: Obstetrics | Admitting: Obstetrics

## 2021-08-02 ENCOUNTER — Other Ambulatory Visit: Payer: Self-pay

## 2021-08-02 DIAGNOSIS — Z1231 Encounter for screening mammogram for malignant neoplasm of breast: Secondary | ICD-10-CM

## 2021-08-28 ENCOUNTER — Encounter: Payer: Self-pay | Admitting: Hematology and Oncology

## 2021-12-28 ENCOUNTER — Encounter: Payer: Self-pay | Admitting: *Deleted

## 2021-12-31 ENCOUNTER — Ambulatory Visit
Admission: RE | Admit: 2021-12-31 | Discharge: 2021-12-31 | Disposition: A | Discharge status: Home / Self Care | Payer: No Typology Code available for payment source | Attending: Gastroenterology | Admitting: Gastroenterology

## 2021-12-31 ENCOUNTER — Ambulatory Visit: Payer: No Typology Code available for payment source | Admitting: Certified Registered"

## 2021-12-31 ENCOUNTER — Encounter
Admission: RE | Disposition: A | Discharge status: Home / Self Care | Payer: Self-pay | Source: Home / Self Care | Attending: Gastroenterology

## 2021-12-31 ENCOUNTER — Encounter: Payer: Self-pay | Admitting: Gastroenterology

## 2021-12-31 DIAGNOSIS — Z8371 Family history of colonic polyps: Secondary | ICD-10-CM | POA: Diagnosis not present

## 2021-12-31 DIAGNOSIS — Z1211 Encounter for screening for malignant neoplasm of colon: Secondary | ICD-10-CM | POA: Insufficient documentation

## 2021-12-31 DIAGNOSIS — E538 Deficiency of other specified B group vitamins: Secondary | ICD-10-CM | POA: Insufficient documentation

## 2021-12-31 DIAGNOSIS — Z8616 Personal history of COVID-19: Secondary | ICD-10-CM | POA: Diagnosis not present

## 2021-12-31 DIAGNOSIS — E559 Vitamin D deficiency, unspecified: Secondary | ICD-10-CM | POA: Diagnosis not present

## 2021-12-31 HISTORY — PX: COLONOSCOPY: SHX5424

## 2021-12-31 HISTORY — DX: Nontoxic single thyroid nodule: E04.1

## 2021-12-31 HISTORY — DX: Gastro-esophageal reflux disease without esophagitis: K21.9

## 2021-12-31 HISTORY — DX: Deficiency of other specified B group vitamins: E53.8

## 2021-12-31 LAB — POCT PREGNANCY, URINE: Preg Test, Ur: NEGATIVE

## 2021-12-31 SURGERY — COLONOSCOPY
Anesthesia: General

## 2021-12-31 MED ORDER — MIDAZOLAM HCL 2 MG/2ML IJ SOLN
INTRAMUSCULAR | Status: DC | PRN
  Administered 2021-12-31: 2 mg via INTRAVENOUS

## 2021-12-31 MED ORDER — FENTANYL CITRATE (PF) 100 MCG/2ML IJ SOLN
INTRAMUSCULAR | Status: DC | PRN
  Administered 2021-12-31: 50 ug via INTRAVENOUS
  Administered 2021-12-31: 25 ug via INTRAVENOUS

## 2021-12-31 MED ORDER — SODIUM CHLORIDE 0.9 % IV SOLN: INTRAVENOUS | Status: DC

## 2021-12-31 MED ORDER — PROPOFOL 500 MG/50ML IV EMUL
INTRAVENOUS | Status: AC
  Filled 2021-12-31: qty 50

## 2021-12-31 MED ORDER — PHENYLEPHRINE HCL (PRESSORS) 10 MG/ML IV SOLN
INTRAVENOUS | Status: DC | PRN
  Administered 2021-12-31 (×2): 200 ug via INTRAVENOUS

## 2021-12-31 MED ORDER — FENTANYL CITRATE (PF) 100 MCG/2ML IJ SOLN
INTRAMUSCULAR | Status: AC
  Filled 2021-12-31: qty 2

## 2021-12-31 MED ORDER — GLYCOPYRROLATE 0.2 MG/ML IJ SOLN
INTRAMUSCULAR | Status: DC | PRN
  Administered 2021-12-31: .2 mg via INTRAVENOUS

## 2021-12-31 MED ORDER — MIDAZOLAM HCL 2 MG/2ML IJ SOLN
INTRAMUSCULAR | Status: AC
  Filled 2021-12-31: qty 2

## 2021-12-31 MED ORDER — LIDOCAINE HCL (CARDIAC) PF 100 MG/5ML IV SOSY
PREFILLED_SYRINGE | INTRAVENOUS | Status: DC | PRN
  Administered 2021-12-31: 30 mg via INTRAVENOUS

## 2021-12-31 MED ORDER — PHENYLEPHRINE HCL (PRESSORS) 10 MG/ML IV SOLN
INTRAVENOUS | Status: AC
  Filled 2021-12-31: qty 1

## 2021-12-31 MED ORDER — PHENYLEPHRINE HCL-NACL 20-0.9 MG/250ML-% IV SOLN
INTRAVENOUS | Status: AC
  Filled 2021-12-31: qty 250

## 2021-12-31 MED ORDER — PROPOFOL 10 MG/ML IV BOLUS
INTRAVENOUS | Status: DC | PRN
  Administered 2021-12-31 (×3): 50 mg via INTRAVENOUS

## 2021-12-31 NOTE — Anesthesia Postprocedure Evaluation (Signed)
Anesthesia Post Note  Patient: Shari Vasquez  Procedure(s) Performed: COLONOSCOPY  Patient location during evaluation: Endoscopy Anesthesia Type: General Level of consciousness: awake and alert Pain management: pain level controlled Vital Signs Assessment: post-procedure vital signs reviewed and stable Respiratory status: spontaneous breathing, nonlabored ventilation, respiratory function stable and patient connected to nasal cannula oxygen Cardiovascular status: blood pressure returned to baseline and stable Postop Assessment: no apparent nausea or vomiting Anesthetic complications: no   No notable events documented.   Last Vitals:  Vitals:   12/31/21 0703 12/31/21 0809  BP: (!) 139/93 (!) 92/55  Pulse: 60   Resp: 18   Temp: (!) 35.7 C (!) 35.7 C  SpO2: 100% 99%    Last Pain:  Vitals:   12/31/21 0839  TempSrc:   PainSc: 0-No pain                 Corinda Gubler

## 2021-12-31 NOTE — H&P (Signed)
Outpatient short stay form Pre-procedure 12/31/2021  Regis Bill, MD  Primary Physician: Mliss Sax, MD  Reason for visit: Screening for family history of polyps  History of present illness:   51 y/o lady here for colonoscopy due to family history of polyps. No blood thinners. No family history of GI malignancies. No blood thinners. No significant abdominal surgeries.   Current Facility-Administered Medications:    0.9 %  sodium chloride infusion, , Intravenous, Continuous, Aerith Canal, Rossie Muskrat, MD, Last Rate: 20 mL/hr at 12/31/21 0719, New Bag at 12/31/21 0719  Medications Prior to Admission  Medication Sig Dispense Refill Last Dose   cyanocobalamin (,VITAMIN B-12,) 1000 MCG/ML injection Inject 1,000 mcg into the muscle every 30 (thirty) days.      folic acid (FOLVITE) 400 MCG tablet Take 400 mcg by mouth daily.      vitamin B-12 (CYANOCOBALAMIN) 100 MCG tablet Take 100 mcg by mouth daily.      Cholecalciferol (VITAMIN D) 50 MCG (2000 UT) tablet Take 2,000 Units by mouth daily.      levonorgestrel (MIRENA) 20 MCG/24HR IUD 1 each by Intrauterine route once.      Melatonin 5 MG CAPS Take by mouth.      methyl salicylate liquid Apply 1 application topically as needed for muscle pain.      Multiple Vitamin (MULTI-VITAMIN DAILY PO) Take by mouth.      Nutritional Supplements (DHEA PO) Take by mouth.      omega-3 acid ethyl esters (LOVAZA) 1 g capsule Take by mouth 2 (two) times daily.      Vitamin A 4.5 MG (15000 UT) TABS Take by mouth.        Allergies  Allergen Reactions   Influenza Vac Split Quad Anaphylaxis   Influenza Vaccines Anaphylaxis   Amoxicillin Diarrhea    yeast infection/ pt doesn't want to take this med.   Ciprofloxacin    Stevia Glycerite Extract [Flavoring Agent] Itching     Past Medical History:  Diagnosis Date   Anemia    B12 deficiency    COVID-19 05/2021   Folate deficiency    GERD (gastroesophageal reflux disease)    Gluten  intolerance    Thyroid nodule    Vitamin D deficiency     Review of systems:  Otherwise negative.    Physical Exam  Gen: Alert, oriented. Appears stated age.  HEENT: PERRLA. Lungs: No respiratory distress CV: RRR Abd: soft, benign, no masses Ext: No edema    Planned procedures: Proceed with colonoscopy. The patient understands the nature of the planned procedure, indications, risks, alternatives and potential complications including but not limited to bleeding, infection, perforation, damage to internal organs and possible oversedation/side effects from anesthesia. The patient agrees and gives consent to proceed.  Please refer to procedure notes for findings, recommendations and patient disposition/instructions.     Regis Bill, MD [facility] Gastroenterology

## 2021-12-31 NOTE — Anesthesia Preprocedure Evaluation (Signed)
Anesthesia Evaluation  Patient identified by MRN, date of birth, ID band Patient awake    Reviewed: Allergy & Precautions, NPO status , Patient's Chart, lab work & pertinent test results  History of Anesthesia Complications Negative for: history of anesthetic complications  Airway Mallampati: I  TM Distance: >3 FB Neck ROM: Full    Dental no notable dental hx. (+) Teeth Intact   Pulmonary neg pulmonary ROS, neg sleep apnea, neg COPD, Patient abstained from smoking.Not current smoker,    Pulmonary exam normal breath sounds clear to auscultation       Cardiovascular Exercise Tolerance: Good METS(-) hypertension(-) CAD and (-) Past MI negative cardio ROS  (-) dysrhythmias  Rhythm:Regular Rate:Normal - Systolic murmurs    Neuro/Psych negative neurological ROS  negative psych ROS   GI/Hepatic GERD  ,(+)     (-) substance abuse  ,   Endo/Other  neg diabetes  Renal/GU negative Renal ROS     Musculoskeletal   Abdominal   Peds  Hematology   Anesthesia Other Findings Past Medical History: No date: Anemia No date: B12 deficiency 05/2021: COVID-19 No date: Folate deficiency No date: GERD (gastroesophageal reflux disease) No date: Gluten intolerance No date: Thyroid nodule No date: Vitamin D deficiency  Reproductive/Obstetrics                             Anesthesia Physical Anesthesia Plan  ASA: 2  Anesthesia Plan: General   Post-op Pain Management: Minimal or no pain anticipated   Induction: Intravenous  PONV Risk Score and Plan: 3 and Propofol infusion, TIVA and Ondansetron  Airway Management Planned: Nasal Cannula  Additional Equipment: None  Intra-op Plan:   Post-operative Plan:   Informed Consent: I have reviewed the patients History and Physical, chart, labs and discussed the procedure including the risks, benefits and alternatives for the proposed anesthesia with the  patient or authorized representative who has indicated his/her understanding and acceptance.     Dental advisory given  Plan Discussed with: CRNA and Surgeon  Anesthesia Plan Comments: (Discussed risks of anesthesia with patient, including possibility of difficulty with spontaneous ventilation under anesthesia necessitating airway intervention, PONV, and rare risks such as cardiac or respiratory or neurological events, and allergic reactions. Discussed the role of CRNA in patient's perioperative care. Patient understands.)        Anesthesia Quick Evaluation

## 2021-12-31 NOTE — Transfer of Care (Signed)
Immediate Anesthesia Transfer of Care Note  Patient: Shari Vasquez  Procedure(s) Performed: COLONOSCOPY  Patient Location: PACU and Endoscopy Unit  Anesthesia Type:MAC  Level of Consciousness: sedated  Airway & Oxygen Therapy: Patient Spontanous Breathing  Post-op Assessment: Report given to RN  Post vital signs: Reviewed and stable  Last Vitals:  Vitals Value Taken Time  BP 92/55 12/31/21 0809  Temp 35.7 C 12/31/21 0809  Pulse 99 12/31/21 0813  Resp 20 12/31/21 0813  SpO2 99 % 12/31/21 0813  Vitals shown include unvalidated device data.  Last Pain:  Vitals:   12/31/21 0809  TempSrc: Temporal  PainSc: Asleep         Complications: No notable events documented.

## 2021-12-31 NOTE — Op Note (Signed)
[facility] Gastroenterology Patient Name: Shari Vasquez Procedure Date: 12/31/2021 6:58 AM MRN: [id number] Account #: [id number] Date of Birth: Nov 27, 1971 Admit Type: Outpatient Age: 51 Room: [facility] ENDO ROOM 3 Gender: Female Note Status: Finalized Instrument Name: Park Meo [id number] Procedure:             Colonoscopy Indications:           Colon cancer screening in patient at increased risk:                         Family history of 1st-degree relative with colon                         polyps, Last colonoscopy 5 years ago Providers:             Andrey Farmer MD, MD Referring MD:          Libby Maw (Referring MD) Medicines:             Monitored Anesthesia Care Complications:         No immediate complications. Procedure:             Pre-Anesthesia Assessment:                        - Prior to the procedure, a History and Physical was                         performed, and patient medications and allergies were                         reviewed. The patient is competent. The risks and                         benefits of the procedure and the sedation options and                         risks were discussed with the patient. All questions                         were answered and informed consent was obtained.                         Patient identification and proposed procedure were                         verified by the physician, the nurse, the                         anesthesiologist, the anesthetist and the technician                         in the endoscopy suite. Mental Status Examination:                         alert and oriented. Airway Examination: normal                         oropharyngeal airway and neck mobility. Respiratory  Examination: clear to auscultation. CV Examination:                         normal. Prophylactic Antibiotics: The patient does not                         require prophylactic antibiotics.  Prior                         Anticoagulants: The patient has taken no previous                         anticoagulant or antiplatelet agents. ASA Grade                         Assessment: I - A normal, healthy patient. After                         reviewing the risks and benefits, the patient was                         deemed in satisfactory condition to undergo the                         procedure. The anesthesia plan was to use monitored                         anesthesia care (MAC). Immediately prior to                         administration of medications, the patient was                         re-assessed for adequacy to receive sedatives. The                         heart rate, respiratory rate, oxygen saturations,                         blood pressure, adequacy of pulmonary ventilation, and                         response to care were monitored throughout the                         procedure. The physical status of the patient was                         re-assessed after the procedure.                        After obtaining informed consent, the colonoscope was                         passed under direct vision. Throughout the procedure,                         the patient's blood pressure, pulse, and oxygen  saturations were monitored continuously. The                         Colonoscope was introduced through the anus and                         advanced to the the terminal ileum. The colonoscopy                         was performed without difficulty. The patient                         tolerated the procedure well. The quality of the bowel                         preparation was good. Findings:      The perianal and digital rectal examinations were normal.      The terminal ileum appeared normal.      The entire examined colon appeared normal.      Retroflexion in the rectum was not performed due to small rectal vault.       Close examination  performed of anal verge by slow withdrawal. Impression:            - The examined portion of the ileum was normal.                        - The entire examined colon is normal.                        - No specimens collected. Recommendation:        - Discharge patient to home.                        - Resume previous diet.                        - Continue present medications.                        - Repeat colonoscopy in 10 years for screening                         purposes.                        - Return to referring physician as previously                         scheduled. Procedure Code(s):     --- Professional ---                        O1308, Colorectal cancer screening; colonoscopy on                         individual at high risk Diagnosis Code(s):     --- Professional ---                        Z83.71, Family history of colonic polyps CPT copyright 2019 American Medical Association. All rights reserved. The codes documented in this  report are preliminary and upon coder review may  be revised to meet current compliance requirements. Andrey Farmer MD, MD 12/31/2021 8:10:41 AM Number of Addenda: 0 Note Initiated On: 12/31/2021 6:58 AM Scope Withdrawal Time: 0 hours 10 minutes 26 seconds  Total Procedure Duration: 0 hours 16 minutes 33 seconds  Estimated Blood Loss:  Estimated blood loss: none.      [facility]

## 2021-12-31 NOTE — Interval H&P Note (Signed)
History and Physical Interval Note:  12/31/2021 7:42 AM  Shari Vasquez  has presented today for surgery, with the diagnosis of family history of polyps of colon.  The various methods of treatment have been discussed with the patient and family. After consideration of risks, benefits and other options for treatment, the patient has consented to  Procedure(s): COLONOSCOPY (N/A) as a surgical intervention.  The patient's history has been reviewed, patient examined, no change in status, stable for surgery.  I have reviewed the patient's chart and labs.  Questions were answered to the patient's satisfaction.     Regis Bill  Ok to proceed with colonoscopy

## 2022-05-27 ENCOUNTER — Ambulatory Visit
Admission: RE | Admit: 2022-05-27 | Discharge: 2022-05-27 | Disposition: A | Discharge status: Home / Self Care | Payer: No Typology Code available for payment source | Source: Ambulatory Visit | Attending: Emergency Medicine | Admitting: Emergency Medicine

## 2022-05-27 VITALS — BP 114/75 | HR 70 | Temp 98.0°F | Resp 18

## 2022-05-27 DIAGNOSIS — W57XXXA Bitten or stung by nonvenomous insect and other nonvenomous arthropods, initial encounter: Secondary | ICD-10-CM | POA: Diagnosis not present

## 2022-05-27 DIAGNOSIS — S70362A Insect bite (nonvenomous), left thigh, initial encounter: Secondary | ICD-10-CM

## 2022-05-27 DIAGNOSIS — R590 Localized enlarged lymph nodes: Secondary | ICD-10-CM | POA: Diagnosis not present

## 2022-05-27 MED ORDER — DOXYCYCLINE HYCLATE 100 MG PO CAPS: 100.0000 mg | ORAL_CAPSULE | Freq: Two times a day (BID) | ORAL | 0 refills | Status: AC

## 2022-05-30 LAB — ROCKY MTN SPOTTED FVR ABS PNL(IGG+IGM)
RMSF IgG: NEGATIVE
RMSF IgM: 0.33 index (ref 0.00–0.89)

## 2022-05-30 LAB — LYME DISEASE SEROLOGY W/REFLEX: Lyme Total Antibody EIA: NEGATIVE

## 2022-09-17 ENCOUNTER — Other Ambulatory Visit: Payer: Self-pay | Admitting: Obstetrics

## 2022-09-17 DIAGNOSIS — Z1231 Encounter for screening mammogram for malignant neoplasm of breast: Secondary | ICD-10-CM

## 2022-09-18 ENCOUNTER — Ambulatory Visit: Payer: No Typology Code available for payment source | Admitting: Family Medicine

## 2022-09-26 ENCOUNTER — Telehealth: Payer: Self-pay | Admitting: Family Medicine

## 2022-09-26 NOTE — Telephone Encounter (Signed)
Pt has brought in a form from [facility] to be filled out. I have placed in Dr Bebe Shaggy folder upfront. When completed she would like this form faxed to [phone]

## 2022-10-03 NOTE — Telephone Encounter (Signed)
Patient aware form faxed over with fax conformation

## 2022-11-01 ENCOUNTER — Ambulatory Visit: Payer: No Typology Code available for payment source
